# Patient Record
Sex: Male | Born: 1960 | Race: White | Hispanic: No | Marital: Married | Smoking: Never smoker
Health system: Southern US, Community
[De-identification: ages and names within clinical notes are randomized; demographics above are authoritative.]

## PROBLEM LIST (undated history)

## (undated) DIAGNOSIS — Z9289 Personal history of other medical treatment: Secondary | ICD-10-CM

## (undated) DIAGNOSIS — F419 Anxiety disorder, unspecified: Secondary | ICD-10-CM

## (undated) DIAGNOSIS — I2510 Atherosclerotic heart disease of native coronary artery without angina pectoris: Secondary | ICD-10-CM

## (undated) DIAGNOSIS — B029 Zoster without complications: Secondary | ICD-10-CM

## (undated) DIAGNOSIS — T7840XA Allergy, unspecified, initial encounter: Secondary | ICD-10-CM

## (undated) DIAGNOSIS — I10 Essential (primary) hypertension: Secondary | ICD-10-CM

## (undated) DIAGNOSIS — I499 Cardiac arrhythmia, unspecified: Secondary | ICD-10-CM

## (undated) DIAGNOSIS — K219 Gastro-esophageal reflux disease without esophagitis: Secondary | ICD-10-CM

## (undated) DIAGNOSIS — Z8619 Personal history of other infectious and parasitic diseases: Secondary | ICD-10-CM

## (undated) DIAGNOSIS — E785 Hyperlipidemia, unspecified: Secondary | ICD-10-CM

## (undated) DIAGNOSIS — T8859XA Other complications of anesthesia, initial encounter: Secondary | ICD-10-CM

## (undated) HISTORY — DX: Gastro-esophageal reflux disease without esophagitis: K21.9

## (undated) HISTORY — DX: Personal history of other infectious and parasitic diseases: Z86.19

## (undated) HISTORY — DX: Anxiety disorder, unspecified: F41.9

## (undated) HISTORY — DX: Zoster without complications: B02.9

## (undated) HISTORY — DX: Personal history of other medical treatment: Z92.89

## (undated) HISTORY — DX: Hyperlipidemia, unspecified: E78.5

## (undated) HISTORY — PX: TENDON REPAIR: SHX5111

## (undated) HISTORY — PX: TONSILLECTOMY: SUR1361

## (undated) HISTORY — DX: Essential (primary) hypertension: I10

## (undated) HISTORY — DX: Allergy, unspecified, initial encounter: T78.40XA

---

## 1995-08-19 DIAGNOSIS — E785 Hyperlipidemia, unspecified: Secondary | ICD-10-CM

## 1995-08-19 DIAGNOSIS — I10 Essential (primary) hypertension: Secondary | ICD-10-CM | POA: Insufficient documentation

## 1995-08-19 HISTORY — DX: Hyperlipidemia, unspecified: E78.5

## 1995-08-19 HISTORY — DX: Essential (primary) hypertension: I10

## 1995-10-19 DIAGNOSIS — Z9289 Personal history of other medical treatment: Secondary | ICD-10-CM

## 1995-10-19 HISTORY — DX: Personal history of other medical treatment: Z92.89

## 2002-02-01 ENCOUNTER — Encounter: Payer: Self-pay | Admitting: Emergency Medicine

## 2002-02-02 ENCOUNTER — Inpatient Hospital Stay (HOSPITAL_COMMUNITY): Admission: EM | Admit: 2002-02-02 | Discharge: 2002-02-02 | Payer: Self-pay | Admitting: Emergency Medicine

## 2002-02-02 ENCOUNTER — Encounter: Payer: Self-pay | Admitting: Internal Medicine

## 2002-06-30 ENCOUNTER — Encounter: Payer: Self-pay | Admitting: Family Medicine

## 2002-06-30 ENCOUNTER — Encounter: Admission: RE | Admit: 2002-06-30 | Discharge: 2002-06-30 | Payer: Self-pay | Admitting: Family Medicine

## 2002-12-18 DIAGNOSIS — K219 Gastro-esophageal reflux disease without esophagitis: Secondary | ICD-10-CM | POA: Insufficient documentation

## 2002-12-18 HISTORY — DX: Gastro-esophageal reflux disease without esophagitis: K21.9

## 2003-02-16 DIAGNOSIS — J309 Allergic rhinitis, unspecified: Secondary | ICD-10-CM | POA: Insufficient documentation

## 2005-01-20 ENCOUNTER — Ambulatory Visit: Payer: Self-pay | Admitting: Family Medicine

## 2005-10-14 ENCOUNTER — Emergency Department (HOSPITAL_COMMUNITY): Admission: EM | Admit: 2005-10-14 | Discharge: 2005-10-14 | Payer: Self-pay | Admitting: Family Medicine

## 2006-01-25 ENCOUNTER — Ambulatory Visit: Payer: Self-pay | Admitting: Family Medicine

## 2006-06-18 ENCOUNTER — Ambulatory Visit: Payer: Self-pay | Admitting: Family Medicine

## 2006-06-21 ENCOUNTER — Ambulatory Visit: Payer: Self-pay | Admitting: Family Medicine

## 2006-08-27 ENCOUNTER — Encounter: Admission: RE | Admit: 2006-08-27 | Discharge: 2006-08-27 | Payer: Self-pay | Admitting: Orthopedic Surgery

## 2006-12-19 ENCOUNTER — Ambulatory Visit: Payer: Self-pay | Admitting: Family Medicine

## 2006-12-21 ENCOUNTER — Ambulatory Visit: Payer: Self-pay | Admitting: Family Medicine

## 2007-01-21 ENCOUNTER — Ambulatory Visit: Payer: Self-pay | Admitting: Family Medicine

## 2007-04-09 ENCOUNTER — Ambulatory Visit: Payer: Self-pay | Admitting: Family Medicine

## 2007-05-17 ENCOUNTER — Telehealth (INDEPENDENT_AMBULATORY_CARE_PROVIDER_SITE_OTHER): Payer: Self-pay | Admitting: *Deleted

## 2007-07-16 ENCOUNTER — Ambulatory Visit: Payer: Self-pay | Admitting: Family Medicine

## 2007-07-17 ENCOUNTER — Ambulatory Visit: Payer: Self-pay | Admitting: Internal Medicine

## 2007-07-17 LAB — CONVERTED CEMR LAB
ALT: 34 units/L (ref 0–53)
AST: 28 units/L (ref 0–37)
Basophils Relative: 0.1 % (ref 0.0–1.0)
Cholesterol: 151 mg/dL (ref 0–200)
Direct LDL: 80.2 mg/dL
Hemoglobin: 14.9 g/dL (ref 13.0–17.0)
Monocytes Absolute: 0.5 10*3/uL (ref 0.2–0.7)
Monocytes Relative: 9.8 % (ref 3.0–11.0)
Platelets: 203 10*3/uL (ref 150–400)
RBC: 5.16 M/uL (ref 4.22–5.81)
RDW: 12.4 % (ref 11.5–14.6)
Total CHOL/HDL Ratio: 7.2
Triglycerides: 233 mg/dL (ref 0–149)
VLDL: 47 mg/dL — ABNORMAL HIGH (ref 0–40)

## 2007-08-28 ENCOUNTER — Telehealth (INDEPENDENT_AMBULATORY_CARE_PROVIDER_SITE_OTHER): Payer: Self-pay | Admitting: *Deleted

## 2007-09-05 ENCOUNTER — Ambulatory Visit: Payer: Self-pay | Admitting: Family Medicine

## 2007-09-05 DIAGNOSIS — I209 Angina pectoris, unspecified: Secondary | ICD-10-CM

## 2007-09-05 DIAGNOSIS — B009 Herpesviral infection, unspecified: Secondary | ICD-10-CM

## 2007-09-05 DIAGNOSIS — R079 Chest pain, unspecified: Secondary | ICD-10-CM | POA: Insufficient documentation

## 2007-09-05 DIAGNOSIS — R739 Hyperglycemia, unspecified: Secondary | ICD-10-CM

## 2008-07-08 ENCOUNTER — Telehealth: Payer: Self-pay | Admitting: Family Medicine

## 2008-07-12 ENCOUNTER — Emergency Department (HOSPITAL_COMMUNITY): Admission: EM | Admit: 2008-07-12 | Discharge: 2008-07-12 | Payer: Self-pay | Admitting: Emergency Medicine

## 2008-10-02 ENCOUNTER — Telehealth: Payer: Self-pay | Admitting: Family Medicine

## 2008-10-06 ENCOUNTER — Encounter: Payer: Self-pay | Admitting: Family Medicine

## 2008-10-27 ENCOUNTER — Telehealth: Payer: Self-pay | Admitting: Family Medicine

## 2008-11-10 ENCOUNTER — Ambulatory Visit: Payer: Self-pay | Admitting: Family Medicine

## 2008-12-01 ENCOUNTER — Ambulatory Visit: Payer: Self-pay | Admitting: Family Medicine

## 2008-12-01 LAB — CONVERTED CEMR LAB
ALT: 19 units/L (ref 0–53)
Basophils Absolute: 0 10*3/uL (ref 0.0–0.1)
Basophils Relative: 0.2 % (ref 0.0–3.0)
Bilirubin, Direct: 0.1 mg/dL (ref 0.0–0.3)
Calcium: 9.6 mg/dL (ref 8.4–10.5)
Cholesterol: 151 mg/dL (ref 0–200)
Creatinine, Ser: 1.1 mg/dL (ref 0.4–1.5)
Creatinine,U: 94.5 mg/dL
GFR calc Af Amer: 93 mL/min
HCT: 44.1 % (ref 39.0–52.0)
Hemoglobin: 15.4 g/dL (ref 13.0–17.0)
MCHC: 34.8 g/dL (ref 30.0–36.0)
MCV: 85.3 fL (ref 78.0–100.0)
Microalb Creat Ratio: 2.1 mg/g (ref 0.0–30.0)
Microalb, Ur: 0.2 mg/dL (ref 0.0–1.9)
Monocytes Absolute: 0.4 10*3/uL (ref 0.1–1.0)
Neutro Abs: 2.9 10*3/uL (ref 1.4–7.7)
RBC: 5.17 M/uL (ref 4.22–5.81)
RDW: 12.4 % (ref 11.5–14.6)
Sodium: 142 meq/L (ref 135–145)
Total Bilirubin: 0.8 mg/dL (ref 0.3–1.2)
Total Protein: 6.8 g/dL (ref 6.0–8.3)
Triglycerides: 142 mg/dL (ref 0–149)

## 2008-12-08 ENCOUNTER — Ambulatory Visit: Payer: Self-pay | Admitting: Family Medicine

## 2009-12-07 ENCOUNTER — Ambulatory Visit: Payer: Self-pay | Admitting: Family Medicine

## 2009-12-07 LAB — CONVERTED CEMR LAB
AST: 20 units/L (ref 0–37)
Albumin: 4.2 g/dL (ref 3.5–5.2)
BUN: 16 mg/dL (ref 6–23)
Calcium: 9.3 mg/dL (ref 8.4–10.5)
Cholesterol: 153 mg/dL (ref 0–200)
GFR calc non Af Amer: 76.03 mL/min (ref >60)
Glucose, Bld: 103 mg/dL — ABNORMAL HIGH (ref 70–99)
HDL: 26.9 mg/dL — ABNORMAL LOW (ref >39.00)
LDL Cholesterol: 105 mg/dL — ABNORMAL HIGH (ref 0–99)
Microalb, Ur: 0.6 mg/dL (ref 0.0–1.9)
Potassium: 3.9 meq/L (ref 3.5–5.1)
Sodium: 143 meq/L (ref 135–145)
Total Bilirubin: 0.7 mg/dL (ref 0.3–1.2)
VLDL: 21.6 mg/dL (ref 0.0–40.0)

## 2009-12-22 ENCOUNTER — Telehealth: Payer: Self-pay | Admitting: Family Medicine

## 2010-01-19 ENCOUNTER — Ambulatory Visit: Payer: Self-pay | Admitting: Family Medicine

## 2010-03-28 ENCOUNTER — Telehealth: Payer: Self-pay | Admitting: Family Medicine

## 2011-01-17 NOTE — Assessment & Plan Note (Signed)
Summary: CPX/CLE   Vital Signs:  Patient profile:   50 year old male Height:      69.5 inches Weight:      231.75 pounds BMI:     33.85 Temp:     98.3 degrees F oral Pulse rate:   76 / minute Pulse rhythm:   regular BP sitting:   132 / 86  (left arm) Cuff size:   large  Vitals Entered By: Sydell Axon LPN (January 19, 2010 1:52 PM) CC: 30 Minute checkup   History of Present Illness: Pt here for Comp Exam, has GI version of what is going around right now. Occas when driving gets SOB with panic attack.  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 One Tab By Mouth At Night 2)  Valtrex 1 Gm Tabs (Valacyclovir Hcl) .... 1/2 Tablet Q Day 3)  Diovan 80 Mg Tabs (Valsartan) .Marland Kitchen.. 1 Tab By Mouth At Night 4)  Astelin 137 Mcg/spray Soln (Azelastine Hcl) .... 2 Sprays Each Nare Every Day Prn 5)  Nexium 40 Mg Cpdr (Esomeprazole Magnesium) .Marland Kitchen.. 1 Daily By Mouth  Allergies: 1)  ! * Bees  Past History:  Past Medical History: Last updated: 04/22/2007 Hypertension (08/19/1995) Hyperlipidemia (08/19/1995) Allergic rhinitis (02/16/2003) GERD (12/18/2002)  Past Surgical History: Last updated: 04/22/2007 ETT wnl  11/96 cardiology consult-- [place] 1/97 R/O'D 02/02/2002  Family History: Last updated: 01/19/2010 Father dec 68 MI CABG x 3 Pacer  Mother A 86  DM Heart Dz  Brother A 59 DM Brother A 57 MIx2 Stents Sister A 62 benign tumor of the brain, eye reconstruction. Sister A 50  Social History: Last updated: 09/05/2007 Occupation:Tool [employer] Married Divorced Remarried  Stepdaughter  Risk Factors: Alcohol Use: <1 (01/19/2010) Caffeine Use: 0 (01/19/2010) Exercise: yes (01/19/2010)  Risk Factors: Smoking Status: never (01/19/2010) Passive Smoke Exposure: no (01/19/2010)  Family History: Father dec 68 MI CABG x 3 Pacer  Mother A 72  DM Heart Dz  Brother A 59 DM Brother A 57 MIx2 Stents Sister A 62 benign tumor of the brain, eye reconstruction. Sister A 50  Review of Systems General:  Denies chills, fatigue, fever, sweats, weakness, and weight loss. Eyes:  Denies blurring, discharge, and eye pain; wears glasses. ENT:  Complains of ringing in ears; denies decreased hearing and earache; L>R. CV:  Denies chest pain or discomfort, fainting, fatigue, palpitations, shortness of breath with exertion, swelling of feet, and swelling of hands. Resp:  Denies cough, shortness of breath, and wheezing. GI:  Denies abdominal pain, bloody stools, change in bowel habits, constipation, dark tarry stools, diarrhea, indigestion, loss of appetite, nausea, vomiting, vomiting blood,  and yellowish skin color. GU:  Denies discharge, dysuria, nocturia, and urinary frequency. MS:  Denies joint pain, joint swelling, low back pain, muscle aches, cramps, and muscle weakness. Derm:  Denies dryness, flushing, itching, and rash. Neuro:  Denies numbness, poor balance, tingling, and tremors.   Impression & Recommendations:  Problem # 1:  HEALTH MAINTENANCE EXAM (ICD-V70.0) Assessment Comment Only  Problem # 2:  HYPERGLYCEMIA, 8/05 (ICD-790.6) Assessment: Unchanged Avoid sweets and carbs.  Problem # 3:   HERPES SIMPLEX, UNCOMPLICATED (ICD-054.9) Assessment: Unchanged Discussed .  Problem # 4:  HYPERLIPIDEMIA (ICD-272.4) Assessment: Unchanged Adequate. His updated medication list for this problem includes:    Lipitor 10 Mg Tabs (Atorvastatin calcium) ..... One tab by mouth at night  Labs Reviewed: SGOT: 20 (12/07/2009)   SGPT: 17 (12/07/2009)   HDL:26.90 (12/07/2009), 24.5 (12/01/2008)  LDL:105 (12/07/2009), 98 (04/54/0981)  Chol:153 (12/07/2009), 151 (12/01/2008)  Trig:108.0 (12/07/2009), 142 (12/01/2008)  Problem # 5:  HYPERTENSION (ICD-401.9) Assessment: Unchanged Stable. Check occas. His updated medication list for this problem includes:    Diovan 80 Mg Tabs (Valsartan) .Marland Kitchen... 1 tab by mouth at night  BP today: 132/86 Prior BP: 120/80 (12/08/2008)  Labs Reviewed: K+: 3.9 (12/07/2009) Creat: : 1.1 (12/07/2009)   Chol: 153 (12/07/2009)   HDL: 26.90 (12/07/2009)   LDL: 105 (12/07/2009)   TG: 108.0 (12/07/2009)  Complete Medication List: 1)  Lipitor 10 Mg Tabs (Atorvastatin calcium) .... One tab by mouth at night 2)  Valtrex 1 Gm Tabs (Valacyclovir hcl) .... 1/2 tablet daily 3)  Diovan 80 Mg Tabs (Valsartan) .Marland Kitchen.. 1 tab by mouth at night 4)  Astelin 137 Mcg/spray Soln (Azelastine hcl) .... 2 sprays each nare every day as needed 5)  Nexium 40 Mg Cpdr (Esomeprazole magnesium) .Marland Kitchen.. 1 daily by mouth  Patient Instructions: 1)  Take Guaifenesin by [facility] or [facility] and getting MUCOUS RELIEF EXPECTORANT (400mg ), take 11/2 tabs by mouth AM and NOON. 2)  Drink lots of fluids anytime taking Guaifenesin.  3)  RTC as needed.  Current Allergies (reviewed today): ! * BEES

## 2011-01-17 NOTE — Progress Notes (Signed)
Summary: Rx Valacyclovir  Phone Note Refill Request Call back at [phone] Message from:  [facility] on December 22, 2009 8:53 AM  Refills Requested: Medication #1:  VALTREX 1 GM TABS 1/2 tablet q day   Last Refilled: 10/04/2009  Medication #2:  LIPITOR 10 MG TABS one tab by mouth at night   Last Refilled: 08/09/2009 Received faxed refill request for Valacyclovir HCL 500mg , one tablet daily, med sheet says 1Gram. Please advise   Method Requested: Electronic Initial call taken by: Linde Gillis CMA Duncan Dull),  December 22, 2009 8:55 AM  Follow-up for Phone Call        1/2 of 1000mg  is 500mg .  Please insure he keeps his appt for PE in Feb and get labs prior (Labs are not scheduled at this point.) Follow-up by: Shaune Leeks MD,  December 22, 2009 10:22 AM  Additional Follow-up for Phone Call Additional follow up Details #1::        Patient advised as instructed.  He says that he already had labs done.  Will keep his PE appt in Feb. Additional Follow-up by: Linde Gillis CMA Duncan Dull),  December 22, 2009 10:31 AM    Prescriptions: LIPITOR 10 MG TABS (ATORVASTATIN CALCIUM) one tab by mouth at night  #30 Each x 1   Entered and Authorized by:   Shaune Leeks MD   Signed by:   Shaune Leeks MD on 12/22/2009   Method used:   [facility]        [facility]  [address] [id number]       [address]       [place], [place]  [address]       Ph: [phone]       Fax: [phone]   RxID:   [id number] VALTREX 1 GM TABS (VALACYCLOVIR HCL) 1/2 tablet q day  #15 Tablet x 1   Entered and Authorized by:   Shaune Leeks MD   Signed by:   Shaune Leeks MD on 12/22/2009   Method used:   Electronically to        [facility]  [address] [phone][facility] (retail)       [address]       [place], [place]  [address]       Ph: [phone]       Fax: [phone]   RxID:   [id number]

## 2011-01-17 NOTE — Progress Notes (Signed)
Summary: Rx Valtrex  Phone Note Refill Request Message [facility] March 28, 2010 8:07 AM  Refills Requested: Medication #1:  VALTREX 1 GM TABS 1/2 tablet daily Received e-scribe refill request   Method Requested: Electronic Initial call taken by: Sydell Axon LPN,  March 28, 2010 8:07 AM    Prescriptions: VALTREX 1 GM TABS (VALACYCLOVIR HCL) 1/2 tablet daily  #15 x 12   Entered and Authorized by:   Shaune Leeks MD   Signed by:   Shaune Leeks MD on 03/28/2010   Method used:   Electronically [facility]        [facility] [id number]       [address]       [place], [place]  [address]       Ph: [phone]       Fax: [phone]   RxID:   [id number]

## 2011-02-02 ENCOUNTER — Telehealth: Payer: Self-pay | Admitting: Family Medicine

## 2011-02-07 ENCOUNTER — Telehealth: Payer: Self-pay | Admitting: Family Medicine

## 2011-02-08 NOTE — Progress Notes (Signed)
Summary: Rx Diovan and  ? Lipitor  Phone Note Refill Request Call back at [phone] Message from:  [facility] on February 02, 2011 2:57 PM  Refills Requested: Medication #1:  DIOVAN 80 MG TABS 1 tab by mouth at night RECEIVED FORM FROM PHARMACY REGARDING CHANGING LIPITOR TO A PREFERRED ALTERNATIVE: ATORVASTATIN, LOVASTATIN OR PRAVASTATIN. FORMS ARE IN YOUR IN BOX.  Initial call taken by: Sydell Axon LPN,  February 02, 2011 3:00 PM  Follow-up for Phone Call        Diovan signed. Need to see him for the Lipitor change. Follow-up by: Shaune Leeks MD,  February 02, 2011 4:14 PM  Additional Follow-up for Phone Call Additional follow up Details #1::        Rx faxed back to [facility]. Sydell Axon LPN  February 02, 2011 4:27 PM  Left message with patient's wife to call back. Sydell Axon LPN  February 02, 2011 4:31 PM     Additional Follow-up for Phone Call Additional follow up Details #2::    Patient says that he has appt in may to see you and front staff told him that was first available Follow-up by: Benny Lennert CMA Duncan Dull),  February 02, 2011 4:39 PM  Prescriptions: DIOVAN 80 MG TABS (VALSARTAN) 1 tab by mouth at night  #90 x 3   Entered by:   Sydell Axon LPN   Authorized by:   Shaune Leeks MD   Signed by:   Sydell Axon LPN on 78/46/9629   Method used:   Handwritten   RxID:   [id number]

## 2011-02-14 NOTE — Progress Notes (Signed)
Summary: new script for valtrex  Phone Note Refill Request Message from:  Fax from Pharmacy  Refills Requested: Medication #1:  VALTREX 1 GM TABS 1/2 tablet daily [facility] on [facility]. They are requesting a new script  Initial call taken by: Lowella Petties CMA, AAMA,  February 07, 2011 5:13 PM  Follow-up for Phone Call        Has appt for Comp Exam in May. Will fill out. Follow-up by: Shaune Leeks MD,  February 08, 2011 7:31 AM  Additional Follow-up for Phone Call Additional follow up Details #1::        [facility]            Lowella Petties CMA, AAMA  February 08, 2011 8:27 AM     Prescriptions: VALTREX 1 GM TABS (VALACYCLOVIR HCL) 1/2 tablet daily  #15 x 12   Entered and Authorized by:   Shaune Leeks MD   Signed by:   Shaune Leeks MD on 02/08/2011   Method used:   Printed then faxed to ...       [facility]  [address] [id number]       [address]       [place], [place]  [address]       Ph: [phone]       Fax: [phone]   RxID:   [id number]

## 2011-02-14 NOTE — Progress Notes (Signed)
Summary: clarification needed on diovan  Phone Note From Pharmacy   [facility]er[facility] Summary of Call: Pharmacy is asking for clarification on diovan script, form is on your desk.                  Lowella Petties CMA, AAMA  February 07, 2011 9:35 AM   Follow-up for Phone Call        One by mouth at night.  Written and signed. Follow-up by: Shaune Leeks MD,  February 08, 2011 7:22 AM  Additional Follow-up for Phone Call Additional follow up Details #1::        [facility].             Lowella Petties CMA, AAMA  February 08, 2011 8:29 AM

## 2011-04-01 ENCOUNTER — Encounter: Payer: Self-pay | Admitting: Family Medicine

## 2011-04-23 ENCOUNTER — Other Ambulatory Visit: Payer: Self-pay | Admitting: Family Medicine

## 2011-04-27 ENCOUNTER — Other Ambulatory Visit: Payer: Self-pay | Admitting: Family Medicine

## 2011-04-27 DIAGNOSIS — R7989 Other specified abnormal findings of blood chemistry: Secondary | ICD-10-CM

## 2011-04-27 DIAGNOSIS — I209 Angina pectoris, unspecified: Secondary | ICD-10-CM

## 2011-04-27 DIAGNOSIS — E785 Hyperlipidemia, unspecified: Secondary | ICD-10-CM

## 2011-04-27 DIAGNOSIS — I10 Essential (primary) hypertension: Secondary | ICD-10-CM

## 2011-04-27 DIAGNOSIS — K219 Gastro-esophageal reflux disease without esophagitis: Secondary | ICD-10-CM

## 2011-04-27 NOTE — Telephone Encounter (Signed)
He has appt next week. OK to fill.

## 2011-04-27 NOTE — Telephone Encounter (Signed)
Can this be refilled? 

## 2011-05-01 ENCOUNTER — Other Ambulatory Visit (INDEPENDENT_AMBULATORY_CARE_PROVIDER_SITE_OTHER): Payer: 59 | Admitting: Family Medicine

## 2011-05-01 ENCOUNTER — Telehealth: Payer: Self-pay | Admitting: *Deleted

## 2011-05-01 DIAGNOSIS — E785 Hyperlipidemia, unspecified: Secondary | ICD-10-CM

## 2011-05-01 DIAGNOSIS — I10 Essential (primary) hypertension: Secondary | ICD-10-CM

## 2011-05-01 DIAGNOSIS — R7989 Other specified abnormal findings of blood chemistry: Secondary | ICD-10-CM

## 2011-05-01 DIAGNOSIS — K219 Gastro-esophageal reflux disease without esophagitis: Secondary | ICD-10-CM

## 2011-05-01 DIAGNOSIS — I209 Angina pectoris, unspecified: Secondary | ICD-10-CM

## 2011-05-01 LAB — RENAL FUNCTION PANEL
Albumin: 4.2 g/dL (ref 3.5–5.2)
BUN: 18 mg/dL (ref 6–23)
CO2: 27 mEq/L (ref 19–32)
Creatinine, Ser: 1 mg/dL (ref 0.4–1.5)
GFR: 86.36 mL/min (ref >60.00)
Phosphorus: 2.7 mg/dL (ref 2.3–4.6)

## 2011-05-01 LAB — CBC WITH DIFFERENTIAL/PLATELET
Basophils Absolute: 0 10*3/uL (ref 0.0–0.1)
Basophils Relative: 0.4 % (ref 0.0–3.0)
HCT: 43.8 % (ref 39.0–52.0)
Hemoglobin: 15.1 g/dL (ref 13.0–17.0)
Lymphocytes Relative: 26.6 % (ref 12.0–46.0)
Lymphs Abs: 1.3 10*3/uL (ref 0.7–4.0)
Monocytes Relative: 9.8 % (ref 3.0–12.0)
Neutro Abs: 2.9 10*3/uL (ref 1.4–7.7)
RBC: 5.15 Mil/uL (ref 4.22–5.81)
RDW: 14.4 % (ref 11.5–14.6)

## 2011-05-01 LAB — LIPID PANEL: VLDL: 31 mg/dL (ref 0.0–40.0)

## 2011-05-01 LAB — HEPATIC FUNCTION PANEL
ALT: 25 U/L (ref 0–53)
AST: 20 U/L (ref 0–37)
Bilirubin, Direct: 0.1 mg/dL (ref 0.0–0.3)
Total Bilirubin: 0.4 mg/dL (ref 0.3–1.2)

## 2011-05-01 LAB — MICROALBUMIN / CREATININE URINE RATIO
Creatinine,U: 140.6 mg/dL
Microalb Creat Ratio: 0.5 mg/g (ref 0.0–30.0)

## 2011-05-01 LAB — TSH: TSH: 0.61 u[IU]/mL (ref 0.35–5.50)

## 2011-05-01 NOTE — Telephone Encounter (Signed)
Pt came in earlier today stating he had found a tick on his back- small deer tick, he doesn't think it was on for very long.  Asked if he should be concerned.  Advised to watch for rashes, fever, joint pain, any signs of illness.  Call back if any problems.

## 2011-05-02 NOTE — Assessment & Plan Note (Signed)
[facility]                                 ON-CALL NOTE   Erik Mitchell, Erik Mitchell                       MRN:          [id number]  DATE:07/12/2008                            DOB:          1961/02/27    Time is 1:30 p.m.   PHONE NUMBER:  [phone].   CHIEF COMPLAINT:  Fever.   The patient said he has had fever and aches since last night.  His  maximum temperature was 101.  He has taken some Advil, but it is not  getting much better.  He has no other symptomsat all, no sore throat,  runny nose, stuffy nose, cough, nausea, vomiting, or diarrhea.  He does  have a little headache, otherwise does not know where the fever is  coming from.  He wants to go to Urgent Care for further evaluation and I  advised him to do so.  Of note, he did not give his regular physician.   Regular Physician is Dr. Hetty Ely.     Marne A. Tower, MD  Electronically Signed    MAT/MedQ  DD: 07/12/2008  DT: 07/12/2008  Job #: [id number]

## 2011-05-02 NOTE — Telephone Encounter (Signed)
Agree. Tick must be adherent for 24 hrs to confer any risk. Thank you.

## 2011-05-03 ENCOUNTER — Encounter: Payer: Self-pay | Admitting: Family Medicine

## 2011-05-05 NOTE — Discharge Summary (Signed)
[place]  Patient:    Erik Mitchell, Erik Mitchell Visit Number: [id number] MRN: [id number]          Service Type: MED Location: [phone] Attending Physician:  Nathen May Dictated by:   Joellyn Rued, P.A.-C. Admit Date:  02/01/2002 Discharge Date: 02/02/2002   CC:         Laurita Quint, M.D. [facility] C. Eden Emms, M.D. [facility]   Discharge Summary  DATE OF BIRTH: [birth year]  HISTORY OF PRESENT ILLNESS: Mr. Ager is a 50 year old white male, who was transported via EMS to [employer] [facility] Emergency Room with chest discomfort around 7 p.m. the evening of admission.  He developed mid sternal chest tightness while driving.  He did not have any associated symptoms and the discomfort lasted for several hours; thus, he came to the emergency room and his discomfort was relieved with sublingual nitroglycerin. It feels like his discomfort is similar to his previous discomfort that he had back in 1995 or 1996, when a stress test performed by Dr. Hetty Ely was negative.  He has a history of heartburn but he denies any of that at this point.  In the emergency room he was pain-free.  He also states he has been under some stress and that he broke up with his girlfriend yesterday.  He has a history of hypertension, hyperlipidemia, mild obesity.  LABORATORY DATA: H&H 15.3 and 43.8, normal indices; platelets 219,000; WBC 6.2.  Sodium 140, potassium 3.4, BUN 18, creatinine 1.2, glucose 105.  CKs and troponins were negative for myocardial infarction.  Fasting lipids showed a total cholesterol of 212, triglyceride 195, HDL 30, LDL 143.  HOSPITAL COURSE: Mr. Gellert was admitted to 62.  Overnight he did not have any further chest discomfort, and he ruled out for myocardial infarction. Stress Cardiolite was performed without difficulty.  Imaging EKG did not show any changes.  Blood pressure and heart rate responded appropriately.  Imaging showed  an EF of 67% without signs of ischemia or scar.  It was felt that he could be discharged home.  DIAGNOSES:  1. Noncardiac chest discomfort.  2. Hyperlipidemia.  3. Hypertension.  DISPOSITION: He is discharged home.  DISCHARGE MEDICATIONS:  1. He was asked to continue Diovan 80 mg q.d.  2. He was also asked to take baby aspirin 81 mg q.d.  DISCHARGE ACTIVITY: Not restricted.  DISCHARGE DIET: He was given instructions and reading information in regard to a low-salt/low-fat/low-cholesterol diet.  He was instructed if he is not able to reduce his hyperlipidemia over the next several months with diet, Dr. Hetty Ely may consider starting him on a prescription medication given his risk factors and early family history of heart disease.  FOLLOW-UP: He was also asked to arrange a follow-up appointment with Dr. Hetty Ely in regard to his chest discomfort.  He was also asked to keep a diary of a food log, causing weight loss program, exercise program, and a diary of his blood pressures. Dictated by:   Joellyn Rued, P.A.-C. Attending Physician:  Nathen May DD:  02/02/02 TD:  02/02/02 Job: [id number]

## 2011-07-26 ENCOUNTER — Other Ambulatory Visit: Payer: Self-pay | Admitting: Family Medicine

## 2011-07-31 ENCOUNTER — Other Ambulatory Visit: Payer: Self-pay | Admitting: *Deleted

## 2011-07-31 MED ORDER — ESOMEPRAZOLE MAGNESIUM 40 MG PO CPDR: DELAYED_RELEASE_CAPSULE | ORAL | Status: DC

## 2011-08-02 ENCOUNTER — Ambulatory Visit (INDEPENDENT_AMBULATORY_CARE_PROVIDER_SITE_OTHER): Payer: Commercial Managed Care - PPO | Admitting: Family Medicine

## 2011-08-02 ENCOUNTER — Encounter: Payer: Self-pay | Admitting: Family Medicine

## 2011-08-02 DIAGNOSIS — K219 Gastro-esophageal reflux disease without esophagitis: Secondary | ICD-10-CM

## 2011-08-02 DIAGNOSIS — I10 Essential (primary) hypertension: Secondary | ICD-10-CM

## 2011-08-02 DIAGNOSIS — J309 Allergic rhinitis, unspecified: Secondary | ICD-10-CM

## 2011-08-02 DIAGNOSIS — R7989 Other specified abnormal findings of blood chemistry: Secondary | ICD-10-CM

## 2011-08-02 DIAGNOSIS — E785 Hyperlipidemia, unspecified: Secondary | ICD-10-CM

## 2011-08-02 NOTE — Assessment & Plan Note (Signed)
Continues slightly elevated. Cautioned to avoid sweets and carbs to keep from accelerating to diabetes.

## 2011-08-02 NOTE — Assessment & Plan Note (Signed)
Stable, use mask when cutting grass.

## 2011-08-02 NOTE — Assessment & Plan Note (Signed)
Adequate on Lipitor 10. Exercise more to get HDL up.

## 2011-08-02 NOTE — Progress Notes (Signed)
  Subjective:    Patient ID: Erik Mitchell, male    DOB: 03/25/1961, 50 y.o.   MRN: [id number]  HPI Pt here for Comp Exam. HE has not been here since his last in Feb, 11. He feels well and has no complaints. He has been doing well. He is back having problems with his knee and will see his orthopedist soon. This has been a problem before.     Review of Systems  Constitutional: Negative for fever, chills, diaphoresis, appetite change, fatigue and unexpected weight change.  HENT: Positive for tinnitus. Negative for hearing loss, ear pain and ear discharge.   Eyes: Negative for pain, discharge and visual disturbance.  Respiratory: Negative for cough, shortness of breath and wheezing.   Cardiovascular: Negative for chest pain and palpitations.       No SOB w/ exertion  Gastrointestinal: Negative for nausea, vomiting, abdominal pain, diarrhea, constipation and blood in stool.       No heartburn or swallowing problems.  Genitourinary: Negative for dysuria, frequency and difficulty urinating.       No nocturia  Musculoskeletal: Positive for arthralgias (left knee pain, twisted 6 mos ago and got better, now worse again.). Negative for myalgias and back pain.  Skin: Negative for rash.       No itching or dryness.  Neurological: Negative for tremors and numbness.       No tingling or balance problems.  Hematological: Negative for adenopathy. Does not bruise/bleed easily.  Psychiatric/Behavioral: Negative for dysphoric mood and agitation.       Objective:   Physical Exam  Constitutional: He is oriented to person, place, and time. He appears well-developed and well-nourished. No distress.  HENT:  Head: Normocephalic and atraumatic.  Right Ear: External ear normal.  Left Ear: External ear normal.  Nose: Nose normal.  Mouth/Throat: Oropharynx is clear and moist.  Eyes: Conjunctivae and EOM are normal. Pupils are equal, round, and reactive to light. Right eye exhibits no discharge. Left eye  exhibits no discharge. No scleral icterus.  Neck: Normal range of motion. Neck supple. No thyromegaly present.  Cardiovascular: Normal rate, regular rhythm, normal heart sounds and intact distal pulses.   No murmur heard. Pulmonary/Chest: Effort normal and breath sounds normal. No respiratory distress. He has no wheezes.  Abdominal: Soft. Bowel sounds are normal. He exhibits no distension and no mass. There is no tenderness. There is no rebound and no guarding.  Genitourinary: Penis normal.       No hernias felt.  Musculoskeletal: Normal range of motion. He exhibits no edema.  Lymphadenopathy:    He has no cervical adenopathy.  Neurological: He is alert and oriented to person, place, and time. Coordination normal.  Skin: Skin is warm and dry. No rash noted. He is not diaphoretic.  Psychiatric: He has a normal mood and affect. His behavior is normal. Judgment and thought content normal.          Assessment & Plan:  HMPE

## 2011-08-02 NOTE — Assessment & Plan Note (Signed)
Well controlled. Cont Nexium.

## 2011-08-02 NOTE — Assessment & Plan Note (Signed)
Adequate control. Cont curr meds. BP Readings from Last 3 Encounters:  08/02/11 132/80  01/19/10 132/86  12/08/08 120/80

## 2011-08-03 ENCOUNTER — Telehealth: Payer: Self-pay | Admitting: *Deleted

## 2011-08-03 NOTE — Telephone Encounter (Signed)
Reggie at pharmacy notified as instructed by telephone.

## 2011-08-03 NOTE — Telephone Encounter (Signed)
Epipen is what I wanted in the first place. Thank them for me.

## 2011-08-03 NOTE — Telephone Encounter (Signed)
Received faxed from [facility] asking if you would want to change the ANA kit to a Epipen because the ANA kit is discontinued. Please advise. Uses [facility] on [address]

## 2011-09-04 ENCOUNTER — Other Ambulatory Visit: Payer: Self-pay | Admitting: Family Medicine

## 2011-09-07 ENCOUNTER — Telehealth: Payer: Self-pay | Admitting: *Deleted

## 2011-09-07 NOTE — Telephone Encounter (Signed)
Form filled out. Has been taking this med since 9/02.

## 2011-09-07 NOTE — Telephone Encounter (Signed)
Prior Berkley Harvey is needed for diovan, form is on your desk.

## 2011-09-08 NOTE — Telephone Encounter (Signed)
Prior auth given for diovan, advised pharmacy.  Approval letter placed on doctor's desk for signature and scanning.

## 2011-09-15 LAB — CBC
Hemoglobin: 15.7
RDW: 13.6
WBC: 5.2

## 2011-09-15 LAB — DIFFERENTIAL
Basophils Absolute: 0
Eosinophils Relative: 1
Lymphocytes Relative: 12
Monocytes Relative: 10
Neutro Abs: 4

## 2012-01-02 ENCOUNTER — Other Ambulatory Visit: Payer: Self-pay | Admitting: Family Medicine

## 2012-03-21 ENCOUNTER — Other Ambulatory Visit: Payer: Self-pay | Admitting: Family Medicine

## 2012-03-22 NOTE — Telephone Encounter (Signed)
Sent!

## 2012-06-10 ENCOUNTER — Encounter: Payer: Self-pay | Admitting: Family Medicine

## 2012-06-10 ENCOUNTER — Ambulatory Visit (INDEPENDENT_AMBULATORY_CARE_PROVIDER_SITE_OTHER): Payer: Commercial Managed Care - PPO | Admitting: Family Medicine

## 2012-06-10 VITALS — BP 130/82 | HR 73 | Temp 98.8°F | Ht 69.0 in | Wt 238.2 lb

## 2012-06-10 DIAGNOSIS — M2550 Pain in unspecified joint: Secondary | ICD-10-CM

## 2012-06-10 DIAGNOSIS — T148 Other injury of unspecified body region: Secondary | ICD-10-CM

## 2012-06-10 DIAGNOSIS — T148XXA Other injury of unspecified body region, initial encounter: Secondary | ICD-10-CM

## 2012-06-10 DIAGNOSIS — W57XXXA Bitten or stung by nonvenomous insect and other nonvenomous arthropods, initial encounter: Secondary | ICD-10-CM

## 2012-06-10 MED ORDER — DOXYCYCLINE HYCLATE 100 MG PO TABS
100.0000 mg | ORAL_TABLET | Freq: Two times a day (BID) | ORAL | Status: AC
Start: 2012-06-10 — End: 2012-06-20

## 2012-06-10 NOTE — Progress Notes (Signed)
Nature conservation officer at [facility] [address] [place] [address] Phone: [phone] Fax: [phone]   Patient Name: Erik Mitchell Date of Birth: March 05, 1961 Age: 51 y.o. Medical Record Number: [id number] Gender: male Date of Encounter: 06/10/2012  History of Present Illness:  Erik Mitchell is a 51 y.o. very pleasant male patient who presents with the following:  Knees, back.  2 weeks.  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   Pleasant gentleman who presents with diffuse polyarthralgias and myalgias over the last several weeks and particularly pain in his knees and his back with some mild effusions in his knees. He has not done anything different compared to his normal active lifestyle. 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 He does this essentially every day, so this is not unusual, and he remembers no specific injury.  He is pulled multiple ticks off of him in the last month or so. 2 of which were dug into his skin quite a bit.  Past Medical History, Surgical History, Social History, Family History, Problem List, Medications, and Allergies have been reviewed and updated if relevant.  Prior to Admission medications   Medication Sig Start Date End Date Taking? Authorizing Provider  aspirin 81 MG tablet Take 81 mg by mouth daily.     Yes Historical Provider, MD  atorvastatin (LIPITOR) 10 MG tablet TAKE  ONE TABLET BY MOUTH NIGHTLY AT BEDTIME 03/21/12  Yes Joaquim Nam, MD  DIOVAN 80 MG tablet TAKE  ONE TABLET BY MOUTH NIGHTLY AT BEDTIME 01/02/12  Yes Joaquim Nam, MD  NEXIUM 40 MG capsule TAKE ONE CAPSULE BY MOUTH ONE TIME DAILY 01/02/12  Yes Joaquim Nam, MD  valACYclovir (VALTREX) 1000 MG tablet TAKE HALF TABLET BY MOUTH DAILY 03/21/12  Yes Joaquim Nam, MD    Review of Systems: ROS: GEN: Acute illness details above GI: Tolerating PO intake GU: maintaining  adequate hydration and urination Pulm: No SOB Interactive and getting along well at home.  Otherwise, ROS is as per the HPI.   Physical Examination: Filed Vitals:   06/10/12 1557  BP: 130/82  Pulse: 73  Temp: 98.8 F (37.1 C)   Filed Vitals:   06/10/12 1557  Height: 5\' 9"  (1.753 m)  Weight: 238 lb 4 oz (108.069 kg)   Body mass index is 35.18 kg/(m^2). Ideal Body Weight: Weight in (lb) to have BMI = 25: 168.9    GEN: WDWN, NAD, Non-toxic, A & O x 3 HEENT: Atraumatic, Normocephalic. Neck supple. No masses, No LAD. Ears and Nose: No external deformity. CV: RRR, No M/G/R. No JVD. No thrill. No extra heart sounds. PULM: CTA B, no wheezes, crackles, rhonchi. No retractions. No resp. distress. No accessory muscle use. EXTR: No c/c/e NEURO Normal gait.  Several areas that are red noted patient to me by the patient where he said ticks were on his skin.  Bilateral knees: Full extension, flexion to 130. Mild effusions. Stable to varus and valgus stress. Mild medial joint line tenderness. Negative McMurray's. Negative flexion pinched testing. Negative Lachman. Negative anterior and posterior drawer testing.  Full range of motion at the back in all directions. Sensation is intact throughout and neurovascularly intact. Deep tendon reflexes are 2+ in the lower extremity. Strength is 5/5. Straight leg raise is negative. Hip flexion and abduction is 5/5. Mildly tender  around L4-S1 PSYCH: Normally interactive. Conversant. Not depressed or anxious appearing.  Calm demeanor.    Assessment and Plan: 1. Arthralgia  doxycycline (VIBRA-TABS) 100 MG tablet, B. burgdorfi antibodies, Rocky mtn spotted fvr abs pnl(IgG+IgM)  2. Tick bite  doxycycline (VIBRA-TABS) 100 MG tablet, B. burgdorfi antibodies, Rocky mtn spotted fvr abs pnl(IgG+IgM)    With his history, think that we need to be conscious and treat him with doxycycline.  It is possible that all this is purely arthritis exacerbation, and I am  also going to have him do some anti-inflammatories at home.  Hannah Beat, MD

## 2012-06-11 LAB — B. BURGDORFI ANTIBODIES: B burgdorferi Ab IgG+IgM: 0.19 {ISR}

## 2012-06-12 ENCOUNTER — Encounter: Payer: Self-pay | Admitting: *Deleted

## 2012-06-12 LAB — ROCKY MTN SPOTTED FVR ABS PNL(IGG+IGM)
RMSF IgG: 0.1 IV
RMSF IgM: 0.04 IV

## 2012-06-13 ENCOUNTER — Telehealth: Payer: Self-pay

## 2012-06-13 NOTE — Telephone Encounter (Signed)
Pt request results for 06/10/12 labs. Patient notified as instructed by telephone. Letter already mailed also.

## 2012-07-31 ENCOUNTER — Ambulatory Visit (INDEPENDENT_AMBULATORY_CARE_PROVIDER_SITE_OTHER): Payer: Commercial Managed Care - PPO | Admitting: Family Medicine

## 2012-07-31 ENCOUNTER — Encounter: Payer: Self-pay | Admitting: Family Medicine

## 2012-07-31 VITALS — BP 130/84 | HR 75 | Temp 97.6°F | Ht 69.0 in | Wt 240.0 lb

## 2012-07-31 DIAGNOSIS — Z125 Encounter for screening for malignant neoplasm of prostate: Secondary | ICD-10-CM

## 2012-07-31 DIAGNOSIS — R5381 Other malaise: Secondary | ICD-10-CM

## 2012-07-31 DIAGNOSIS — R5383 Other fatigue: Secondary | ICD-10-CM

## 2012-07-31 DIAGNOSIS — E785 Hyperlipidemia, unspecified: Secondary | ICD-10-CM

## 2012-07-31 DIAGNOSIS — M2550 Pain in unspecified joint: Secondary | ICD-10-CM

## 2012-07-31 LAB — BASIC METABOLIC PANEL
Calcium: 9.9 mg/dL (ref 8.4–10.5)
Chloride: 104 mEq/L (ref 96–112)
Creatinine, Ser: 1.1 mg/dL (ref 0.4–1.5)

## 2012-07-31 LAB — HEPATIC FUNCTION PANEL
ALT: 24 U/L (ref 0–53)
Alkaline Phosphatase: 89 U/L (ref 39–117)
Bilirubin, Direct: 0.1 mg/dL (ref 0.0–0.3)
Total Protein: 7.1 g/dL (ref 6.0–8.3)

## 2012-07-31 LAB — CBC WITH DIFFERENTIAL/PLATELET
Basophils Relative: 0.5 % (ref 0.0–3.0)
Eosinophils Relative: 2.2 % (ref 0.0–5.0)
Lymphocytes Relative: 25.2 % (ref 12.0–46.0)
Neutrophils Relative %: 63.1 % (ref 43.0–77.0)
RBC: 5.07 Mil/uL (ref 4.22–5.81)
WBC: 4.6 10*3/uL (ref 4.5–10.5)

## 2012-07-31 NOTE — Progress Notes (Signed)
[facility] at [facility] [address] [place] [address] Phone: [phone] Fax: [phone]  Date:  07/31/2012   Name:  Erik Mitchell   DOB:  15-Jun-1961   MRN:  [id number] Gender: male  Age: 51 y.o.  PCP:  Crawford Givens, MD    Chief Complaint: Hypertension   History of Present Illness:  Erik Mitchell is a 51 y.o. very pleasant male patient who presents with the following:  The patient comes in the office not feeling well, and I am asked to see him urgently. He reports that his blood pressure was 200/115 at work, but when the paramedics were called his blood pressure normalized. On my check in the office it is 120/84.  Overall he does not feel that well, he has some coughing, congestion. Some nasal congestion and overall achiness.  I treated him a few weeks ago for potential tickborne illness with a course of doxycycline. There are somewhat concerned about Lyme disease potentially.  He has never had other diffuse arthralgias and has tolerated his statin without difficulties. He is not having any joint effusions.  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 Feels run down.   120/84.  Coughing and having a lot of congestion, some trouble breathing and nose is stopped up  Past Medical History, Surgical History, Social History, Family History, Problem List, Medications, and Allergies have been reviewed and updated if relevant.  Current Outpatient Prescriptions on File Prior to Visit  Medication Sig Dispense Refill  . aspirin 81 MG tablet Take 81 mg by mouth daily.        Marland Kitchen atorvastatin (LIPITOR) 10 MG tablet TAKE  ONE TABLET BY MOUTH NIGHTLY AT BEDTIME  30 tablet  4  . DIOVAN 80 MG tablet TAKE  ONE TABLET BY MOUTH NIGHTLY AT BEDTIME  30 each  6  . NEXIUM 40 MG capsule TAKE ONE CAPSULE BY MOUTH ONE TIME DAILY  30 each  6  . valACYclovir (VALTREX) 1000 MG tablet TAKE HALF TABLET BY MOUTH DAILY   15 tablet  5    Review of Systems: As above. No fever, chills, or sweats. Some chest pain. No nausea, vomiting, or diarrhea.  Physical Examination: Filed Vitals:   07/31/12 1241  BP: 130/84  Pulse: 75  Temp: 97.6 F (36.4 C)   Filed Vitals:   07/31/12 1241  Height: 5\' 9"  (1.753 m)  Weight: 240 lb (108.863 kg)   Body mass index is 35.44 kg/(m^2). Ideal Body Weight: Weight in (lb) to have BMI = 25: 168.9    GEN: WDWN, NAD, Non-toxic, A & O x 3 HEENT: Atraumatic, Normocephalic. Neck supple. No masses, No LAD. Ears and Nose: No external deformity. CV: RRR, No M/G/R. No JVD. No thrill. No extra heart sounds. PULM: CTA B, no wheezes, crackles, rhonchi. No retractions. No resp. distress. No accessory muscle use. EXTR: No c/c/e NEURO Normal gait.  PSYCH: Normally interactive. Conversant. Not depressed or anxious appearing.  Calm demeanor.    Assessment and Plan:  1. Fatigue  Basic metabolic panel, CBC with Differential, Hepatic function panel, TSH, Lyme disease, western blot [[facility]]  2. Special screening for malignant neoplasm of prostate  PSA  3. Other and unspecified hyperlipidemia  LDL cholesterol, direct  4. Arthralgia  Lyme disease, western blot [[facility]]   Overall not feeling well. He is normotensive in the office. He certainly does need to use a large  blood pressure cuff for accuracy. I am suspicious that the wrists blood pressure readings that he got at his work were not accurate since the paramedics also had normal low-pressure readings. I have asked him to monitor them at home every day, and get back with his primary care provider if readings are elevated.  I am going to check a Western blot Lyme test to definitively answer the question whether or not my diseases at play.  Also check basic laboratories and follow up with PCP for full physical  Orders Today:  Orders Placed This Encounter  Procedures  . Basic metabolic panel  . CBC with Differential  . Hepatic  function panel  . TSH  . PSA  . LDL cholesterol, direct  . Lyme disease, western blot [[facility]]    Medications Today: (Includes new updates added during medication reconciliation) No orders of the defined types were placed in this encounter.    Medications Discontinued: There are no discontinued medications.   Hannah Beat, MD

## 2012-08-02 LAB — LYME DISEASE, WESTERN BLOT
IgG P30 Ab.: ABSENT
IgG P39 Ab.: ABSENT
IgG P45 Ab.: ABSENT
IgG P58 Ab.: ABSENT
IgM P23 Ab.: ABSENT
Lyme IgG Wb: NEGATIVE
Lyme IgM Wb: NEGATIVE

## 2012-08-05 ENCOUNTER — Encounter: Payer: Self-pay | Admitting: *Deleted

## 2012-08-26 ENCOUNTER — Other Ambulatory Visit: Payer: Self-pay | Admitting: Family Medicine

## 2012-08-26 DIAGNOSIS — E7800 Pure hypercholesterolemia, unspecified: Secondary | ICD-10-CM

## 2012-08-28 ENCOUNTER — Other Ambulatory Visit (INDEPENDENT_AMBULATORY_CARE_PROVIDER_SITE_OTHER): Payer: Commercial Managed Care - PPO

## 2012-08-28 DIAGNOSIS — E7800 Pure hypercholesterolemia, unspecified: Secondary | ICD-10-CM

## 2012-08-29 LAB — GLUCOSE, RANDOM: Glucose, Bld: 95 mg/dL (ref 70–99)

## 2012-08-29 LAB — LIPID PANEL: HDL: 27.6 mg/dL — ABNORMAL LOW (ref >39.00)

## 2012-09-03 ENCOUNTER — Other Ambulatory Visit: Payer: Commercial Managed Care - PPO

## 2012-09-10 ENCOUNTER — Encounter: Payer: Commercial Managed Care - PPO | Admitting: Family Medicine

## 2012-09-30 ENCOUNTER — Ambulatory Visit (INDEPENDENT_AMBULATORY_CARE_PROVIDER_SITE_OTHER): Payer: Commercial Managed Care - PPO | Admitting: Family Medicine

## 2012-09-30 ENCOUNTER — Encounter: Payer: Self-pay | Admitting: Family Medicine

## 2012-09-30 VITALS — BP 144/90 | HR 68 | Temp 98.0°F | Ht 69.5 in | Wt 267.0 lb

## 2012-09-30 DIAGNOSIS — Z0000 Encounter for general adult medical examination without abnormal findings: Secondary | ICD-10-CM

## 2012-09-30 DIAGNOSIS — B009 Herpesviral infection, unspecified: Secondary | ICD-10-CM

## 2012-09-30 DIAGNOSIS — K219 Gastro-esophageal reflux disease without esophagitis: Secondary | ICD-10-CM

## 2012-09-30 DIAGNOSIS — Z1211 Encounter for screening for malignant neoplasm of colon: Secondary | ICD-10-CM

## 2012-09-30 DIAGNOSIS — I10 Essential (primary) hypertension: Secondary | ICD-10-CM

## 2012-09-30 DIAGNOSIS — E785 Hyperlipidemia, unspecified: Secondary | ICD-10-CM

## 2012-09-30 MED ORDER — ESOMEPRAZOLE MAGNESIUM 40 MG PO CPDR: DELAYED_RELEASE_CAPSULE | ORAL | Status: DC

## 2012-09-30 MED ORDER — VALSARTAN 80 MG PO TABS: ORAL_TABLET | ORAL | Status: DC

## 2012-09-30 MED ORDER — EPINEPHRINE 0.3 MG/0.3ML IJ DEVI: 0.3000 mg | Freq: Once | INTRAMUSCULAR | Status: DC

## 2012-09-30 MED ORDER — ATORVASTATIN CALCIUM 10 MG PO TABS: ORAL_TABLET | ORAL | Status: DC

## 2012-09-30 MED ORDER — VALACYCLOVIR HCL 1 G PO TABS: ORAL_TABLET | ORAL | Status: DC

## 2012-09-30 NOTE — Patient Instructions (Addendum)
I would get a flu shot each fall.   Take care.  Try to walk more.  Recheck labs in 1 year.   Glad to see you.

## 2012-09-30 NOTE — Progress Notes (Signed)
CPE- See plan.  Routine anticipatory guidance given to patient.  See health maintenance. Tetanus 2005 Flu shot done at work, will get later in 10/13.  PNA shot 2009 D/w patient [id number] for colon cancer screening, including IFOB vs. colonoscopy.  Risks and benefits of both were discussed and patient voiced understanding.  Pt elects for: IFOB.   PSA recently wnl.  Advance directive discussed, wife is designated if incapacitated.   Hypertension:    Using medication without problems or lightheadedness: yes Chest pain with exertion:no Edema:no Short of breath:no Average home BPs: usually 120s/80s Limited exericise  Elevated Cholesterol: Using medications without problems:yes Muscle aches: no Diet compliance: discussed.   Exercise: discussed.    GERD, controlled with current meds. No heartburn.  No ADE from medicine.   Oral HSV.  Controlled with rare flares. No ADE from medicine.   PMH and SH reviewed  Meds, vitals, and allergies reviewed.   ROS: See HPI.  Otherwise negative.    GEN: nad, alert and oriented HEENT: mucous membranes moist NECK: supple w/o LA CV: rrr. PULM: ctab, no inc wob ABD: soft, +bs EXT: no edema SKIN: no acute rash

## 2012-10-01 DIAGNOSIS — Z0000 Encounter for general adult medical examination without abnormal findings: Secondary | ICD-10-CM | POA: Insufficient documentation

## 2012-10-01 NOTE — Assessment & Plan Note (Signed)
Usually controlled, continue current meds and work on diet/weight/exercise.

## 2012-10-01 NOTE — Assessment & Plan Note (Signed)
Routine anticipatory guidance given to patient.  See health maintenance. Tetanus 2005 Flu shot done at work, will get later in 10/13.  PNA shot 2009 D/w patient [id number] for colon cancer screening, including IFOB vs. colonoscopy.  Risks and benefits of both were discussed and patient voiced understanding.  Pt elects for: IFOB.   PSA recently wnl.  Advance directive discussed, wife is designated if incapacitated.

## 2012-10-01 NOTE — Assessment & Plan Note (Signed)
Continue valtrex, doing well.

## 2012-10-01 NOTE — Assessment & Plan Note (Signed)
Sx controlled on current meds. continue current meds and work on diet/weight/exercise.

## 2012-10-01 NOTE — Assessment & Plan Note (Signed)
Labs d/w pt. Continue current meds and work on diet/weight/exercise.

## 2013-08-08 ENCOUNTER — Ambulatory Visit (INDEPENDENT_AMBULATORY_CARE_PROVIDER_SITE_OTHER): Payer: Commercial Managed Care - PPO | Admitting: Family Medicine

## 2013-08-08 ENCOUNTER — Encounter: Payer: Self-pay | Admitting: Family Medicine

## 2013-08-08 VITALS — BP 130/78 | HR 72 | Temp 98.1°F | Wt 232.2 lb

## 2013-08-08 DIAGNOSIS — F410 Panic disorder [episodic paroxysmal anxiety] without agoraphobia: Secondary | ICD-10-CM | POA: Insufficient documentation

## 2013-08-08 DIAGNOSIS — J020 Streptococcal pharyngitis: Secondary | ICD-10-CM

## 2013-08-08 DIAGNOSIS — J069 Acute upper respiratory infection, unspecified: Secondary | ICD-10-CM

## 2013-08-08 MED ORDER — LIDOCAINE VISCOUS 2 % MT SOLN: 10.0000 mL | OROMUCOSAL | Status: DC | PRN

## 2013-08-08 NOTE — Progress Notes (Signed)
Sx started a few days, ST noted.  Mild drainage but the ST is the main issue.  No fevers.   Rhinorrhea: minimal congestion:minimal ear pain: no sore throat:yes Cough: minimal Myalgias: no  Sunday was in the woods, possible insect bites.  Was itching at the bite sites, better today.  He though this was not related to the above.    ROS: See HPI.  Otherwise negative.    Meds, vitals, and allergies reviewed.   GEN: nad, alert and oriented HEENT: mucous membranes moist, TM w/o erythema, nasal epithelium injected, OP with cobblestoning NECK: supple w/o LA CV: rrr. PULM: ctab, no inc wob ABD: soft, +bs EXT: no edema RST neg

## 2013-08-08 NOTE — Assessment & Plan Note (Signed)
On the way out- OBTW- likely panic sx episodically for the last year.  Asked pt to track sx, stressors, inciting events and then notify me.  He agrees.

## 2013-08-08 NOTE — Patient Instructions (Addendum)
Use the lidocaine for your sore throat.   Drink plenty of fluids, take tylenol as needed, and gargle with warm salt water for your throat.  This should gradually improve.  Take care.  Let us know if you have other concerns.

## 2013-08-08 NOTE — Assessment & Plan Note (Signed)
rst neg, likely viral.  Supportive tx.  Nontoxic.  Fu prn. See instructions.

## 2013-10-01 ENCOUNTER — Other Ambulatory Visit: Payer: Self-pay | Admitting: Family Medicine

## 2013-10-02 ENCOUNTER — Other Ambulatory Visit: Payer: Self-pay | Admitting: *Deleted

## 2013-10-02 NOTE — Telephone Encounter (Signed)
Schedule a CPE.  rx sent.

## 2013-10-02 NOTE — Telephone Encounter (Signed)
Electronic refill request. Patient due for CPE.  Please advise.

## 2013-10-03 NOTE — Telephone Encounter (Signed)
Patient advised.   Will call back on his lunch break to schedule CPE

## 2014-01-05 ENCOUNTER — Other Ambulatory Visit: Payer: Self-pay | Admitting: Family Medicine

## 2014-01-21 ENCOUNTER — Encounter: Payer: Self-pay | Admitting: Family Medicine

## 2014-01-21 ENCOUNTER — Ambulatory Visit (INDEPENDENT_AMBULATORY_CARE_PROVIDER_SITE_OTHER): Payer: Commercial Managed Care - PPO | Admitting: Family Medicine

## 2014-01-21 VITALS — BP 140/94 | HR 68 | Temp 98.5°F | Wt 238.0 lb

## 2014-01-21 DIAGNOSIS — J069 Acute upper respiratory infection, unspecified: Secondary | ICD-10-CM

## 2014-01-21 MED ORDER — BENZONATATE 200 MG PO CAPS: 200.0000 mg | ORAL_CAPSULE | Freq: Three times a day (TID) | ORAL | Status: DC | PRN

## 2014-01-21 NOTE — Assessment & Plan Note (Signed)
Likely viral, nontoxic, supportive care. F/u prn.  He agrees.

## 2014-01-21 NOTE — Progress Notes (Signed)
Pre-visit discussion using our clinic review tool. No additional management support is needed unless otherwise documented below in the visit note.  Sx started about 3-4 days ago.  Started with cough, sneezing. Nose burning. Sx worse for 2 days, some better today.  Now with hoarse voice today.  No fevers. No vomiting, no diarrhea.  No rash.  No ear pain but some popping.  Cough is better now.  Chest can burn with a cough.  Likely mult sick contacts at work.   Meds, vitals, and allergies reviewed.   ROS: See HPI.  Otherwise, noncontributory.  GEN: nad, alert and oriented HEENT: mucous membranes moist, tm w/o erythema, nasal exam w/o erythema, clear discharge noted,  OP with cobblestoning NECK: supple w/o LA CV: rrr.   PULM: ctab, no inc wob EXT: no edema SKIN: no acute rash

## 2014-01-21 NOTE — Patient Instructions (Signed)
Drink plenty of fluids, take tylenol as needed, and gargle with warm salt water for your throat.  This should gradually improve.  Take care.  Let us know if you have other concerns.   Take tessalon for the cough.

## 2014-02-13 ENCOUNTER — Other Ambulatory Visit: Payer: Self-pay | Admitting: Family Medicine

## 2014-02-13 DIAGNOSIS — I10 Essential (primary) hypertension: Secondary | ICD-10-CM

## 2014-02-16 ENCOUNTER — Other Ambulatory Visit (INDEPENDENT_AMBULATORY_CARE_PROVIDER_SITE_OTHER): Payer: Commercial Managed Care - PPO

## 2014-02-16 ENCOUNTER — Telehealth: Payer: Self-pay | Admitting: Family Medicine

## 2014-02-16 DIAGNOSIS — I10 Essential (primary) hypertension: Secondary | ICD-10-CM

## 2014-02-16 LAB — COMPREHENSIVE METABOLIC PANEL
ALK PHOS: 76 U/L (ref 39–117)
ALT: 22 U/L (ref 0–53)
AST: 17 U/L (ref 0–37)
Albumin: 4.2 g/dL (ref 3.5–5.2)
BILIRUBIN TOTAL: 0.7 mg/dL (ref 0.3–1.2)
BUN: 15 mg/dL (ref 6–23)
CO2: 27 meq/L (ref 19–32)
CREATININE: 1.1 mg/dL (ref 0.4–1.5)
Calcium: 9.6 mg/dL (ref 8.4–10.5)
Chloride: 104 mEq/L (ref 96–112)
GFR: 78 mL/min (ref >60.00)
GLUCOSE: 97 mg/dL (ref 70–99)
Potassium: 3.9 mEq/L (ref 3.5–5.1)
Sodium: 137 mEq/L (ref 135–145)
Total Protein: 7.7 g/dL (ref 6.0–8.3)

## 2014-02-16 LAB — LIPID PANEL
CHOLESTEROL: 176 mg/dL (ref 0–200)
HDL: 26.4 mg/dL — AB (ref >39.00)
LDL CALC: 100 mg/dL — AB (ref 0–99)
Total CHOL/HDL Ratio: 7
Triglycerides: 250 mg/dL — ABNORMAL HIGH (ref 0.0–149.0)
VLDL: 50 mg/dL — AB (ref 0.0–40.0)

## 2014-02-16 NOTE — Telephone Encounter (Signed)
Relevant patient education assigned to patient using Emmi. ° °

## 2014-02-27 ENCOUNTER — Encounter: Payer: Self-pay | Admitting: Family Medicine

## 2014-02-27 ENCOUNTER — Ambulatory Visit (INDEPENDENT_AMBULATORY_CARE_PROVIDER_SITE_OTHER): Payer: Commercial Managed Care - PPO | Admitting: Family Medicine

## 2014-02-27 VITALS — BP 142/92 | HR 77 | Temp 97.6°F | Ht 69.5 in | Wt 241.2 lb

## 2014-02-27 DIAGNOSIS — Z1211 Encounter for screening for malignant neoplasm of colon: Secondary | ICD-10-CM

## 2014-02-27 DIAGNOSIS — F410 Panic disorder [episodic paroxysmal anxiety] without agoraphobia: Secondary | ICD-10-CM

## 2014-02-27 DIAGNOSIS — Z0000 Encounter for general adult medical examination without abnormal findings: Secondary | ICD-10-CM

## 2014-02-27 DIAGNOSIS — I10 Essential (primary) hypertension: Secondary | ICD-10-CM

## 2014-02-27 DIAGNOSIS — E669 Obesity, unspecified: Secondary | ICD-10-CM

## 2014-02-27 DIAGNOSIS — Z23 Encounter for immunization: Secondary | ICD-10-CM

## 2014-02-27 DIAGNOSIS — E785 Hyperlipidemia, unspecified: Secondary | ICD-10-CM

## 2014-02-27 MED ORDER — VALACYCLOVIR HCL 1 G PO TABS: 500.0000 mg | ORAL_TABLET | Freq: Every day | ORAL | Status: DC

## 2014-02-27 MED ORDER — EPINEPHRINE 0.3 MG/0.3ML IJ SOAJ: 0.3000 mg | Freq: Once | INTRAMUSCULAR | Status: DC

## 2014-02-27 MED ORDER — VALSARTAN 80 MG PO TABS: ORAL_TABLET | ORAL | Status: DC

## 2014-02-27 MED ORDER — ESOMEPRAZOLE MAGNESIUM 40 MG PO CPDR
DELAYED_RELEASE_CAPSULE | ORAL | Status: DC
Start: 2014-02-27 — End: 2014-04-01

## 2014-02-27 MED ORDER — ATORVASTATIN CALCIUM 10 MG PO TABS: ORAL_TABLET | ORAL | Status: DC

## 2014-02-27 NOTE — Progress Notes (Signed)
Pre visit review using our clinic review tool, if applicable. No additional management support is needed unless otherwise documented below in the visit note.  CPE- See plan.  Routine anticipatory guidance given to patient.  See health maintenance. Tetanus 2015 Flu shot prev done. Done at work.  Shingles and PNA shots not due.  D/w patient [id number] for colon cancer screening, including IFOB vs. colonoscopy.  Risks and benefits of both were discussed and patient voiced understanding.  Pt elects for: IFOB.   Prostate cancer screening and PSA options (with potential risks and benefits of testing vs not testing) were discussed along with recent recs/guidelines.  He declined testing PSA at this point.  Living will d/w pt.  Wife would be designated if incapacitated.   Diet and exercise d/w pt. Encouraged both.  Limited by occ R knee pain.  He'll occ use a brace.  No trauma. Can be worse with weather changes.    Hypertension:    Using medication without problems or lightheadedness: yes Chest pain with exertion:no Edema:no Short of breath:no  Elevated Cholesterol: Using medications without problems: yes Muscle aches: no Diet compliance:see above Exercise: see above  Panic sx.  With unknown places, ie travel.  Worse in crowds.  Claustrophobia noted.  Some better recently, he'll consider counseling.  No SI/HI.   PMH and SH reviewed  Meds, vitals, and allergies reviewed.   ROS: See HPI.  Otherwise negative.    GEN: nad, alert and oriented HEENT: mucous membranes moist NECK: supple w/o LA CV: rrr. PULM: ctab, no inc wob ABD: soft, +bs EXT: no edema SKIN: no acute rash

## 2014-02-27 NOTE — Patient Instructions (Signed)
Go to the lab on the way out.  We'll contact you with your lab report. Take care.  If you want to go to counseling, then let me know.  Recheck in about 1 year.  Glad to see you.

## 2014-03-01 DIAGNOSIS — E669 Obesity, unspecified: Secondary | ICD-10-CM | POA: Insufficient documentation

## 2014-03-01 NOTE — Assessment & Plan Note (Signed)
D/w pt.  Goal for weight loss.  Wouldn't change meds at this point. Labs d/w pt.  

## 2014-03-01 NOTE — Assessment & Plan Note (Signed)
D/w pt.  Goal for weight loss.  Wouldn't change meds at this point. Labs d/w pt.

## 2014-03-01 NOTE — Assessment & Plan Note (Signed)
Some better recently, he'll consider counseling.

## 2014-03-01 NOTE — Assessment & Plan Note (Signed)
Routine anticipatory guidance given to patient.  See health maintenance. Tetanus 2015 Flu shot prev done. Done at work.  Shingles and PNA shots not due.  D/w patient [id number] for colon cancer screening, including IFOB vs. colonoscopy.  Risks and benefits of both were discussed and patient voiced understanding.  Pt elects for: IFOB.   Prostate cancer screening and PSA options (with potential risks and benefits of testing vs not testing) were discussed along with recent recs/guidelines.  He declined testing PSA at this point.  Living will d/w pt.  Wife would be designated if incapacitated.   Diet and exercise d/w pt. Encouraged both.  Limited by occ R knee pain.  He'll occ use a brace.  No trauma. Can be worse with weather changes.

## 2014-03-02 ENCOUNTER — Telehealth: Payer: Self-pay | Admitting: Family Medicine

## 2014-03-02 NOTE — Telephone Encounter (Signed)
Relevant patient education assigned to patient using Emmi. ° °

## 2014-04-01 ENCOUNTER — Other Ambulatory Visit: Payer: Self-pay | Admitting: *Deleted

## 2014-04-01 MED ORDER — ATORVASTATIN CALCIUM 10 MG PO TABS: ORAL_TABLET | ORAL | Status: DC

## 2014-04-01 MED ORDER — VALSARTAN 80 MG PO TABS: ORAL_TABLET | ORAL | Status: DC

## 2014-04-01 MED ORDER — VALACYCLOVIR HCL 1 G PO TABS: 500.0000 mg | ORAL_TABLET | Freq: Every day | ORAL | Status: DC

## 2014-04-01 MED ORDER — ESOMEPRAZOLE MAGNESIUM 40 MG PO CPDR: DELAYED_RELEASE_CAPSULE | ORAL | Status: DC

## 2015-01-01 ENCOUNTER — Ambulatory Visit (INDEPENDENT_AMBULATORY_CARE_PROVIDER_SITE_OTHER): Payer: Commercial Managed Care - PPO | Admitting: Family Medicine

## 2015-01-01 ENCOUNTER — Encounter: Payer: Self-pay | Admitting: Family Medicine

## 2015-01-01 VITALS — BP 138/90 | HR 72 | Temp 98.6°F | Wt 239.5 lb

## 2015-01-01 DIAGNOSIS — I10 Essential (primary) hypertension: Secondary | ICD-10-CM

## 2015-01-01 MED ORDER — VALSARTAN 80 MG PO TABS: ORAL_TABLET | ORAL | Status: DC

## 2015-01-01 NOTE — Patient Instructions (Signed)
Increase to 2 diovan a day.  Check your BP a few times in the meantime.  Recheck labs in about 7-10 days- get a lab appointment scheduled.  Update me with your BP at that point.  Take care.  Glad to see you.

## 2015-01-01 NOTE — Progress Notes (Signed)
Pre visit review using our clinic review tool, if applicable. No additional management support is needed unless otherwise documented below in the visit note.  He had checked BP at home, up to 158/110.   Checked BP at home 149/100 before coming to clinic.  He has checked it on mult cuffs and both were up.   He had noted the inc in the last week or so.   No CP, SOB, BLE edema.  Had a HA when BP was up.  Some knee aches.   No more salt in diet.  His mother has colon cancer and is not doing well.  He found about that recently.    Meds, vitals, and allergies reviewed.   ROS: See HPI.  Otherwise, noncontributory.  GEN: nad, alert and oriented HEENT: mucous membranes moist NECK: supple w/o LA CV: rrr.  no murmur PULM: ctab, no inc wob EXT: no edema

## 2015-01-03 NOTE — Assessment & Plan Note (Signed)
Condolences offered re: his mother's illness.  He thanked me.  Increase to 160 diovan a day. He'll check BP a few times in the meantime.  Recheck labs in about 7-10 days- he'll get a lab appointment scheduled and he'll update me with BP at that point.  He agrees.

## 2015-01-15 ENCOUNTER — Other Ambulatory Visit (INDEPENDENT_AMBULATORY_CARE_PROVIDER_SITE_OTHER): Payer: Commercial Managed Care - PPO

## 2015-01-15 DIAGNOSIS — I10 Essential (primary) hypertension: Secondary | ICD-10-CM

## 2015-01-15 LAB — BASIC METABOLIC PANEL
BUN: 19 mg/dL (ref 6–23)
CHLORIDE: 105 meq/L (ref 96–112)
CO2: 25 meq/L (ref 19–32)
CREATININE: 1.15 mg/dL (ref 0.40–1.50)
Calcium: 9.7 mg/dL (ref 8.4–10.5)
GFR: 70.75 mL/min (ref >60.00)
GLUCOSE: 109 mg/dL — AB (ref 70–99)
POTASSIUM: 4 meq/L (ref 3.5–5.1)
Sodium: 140 mEq/L (ref 135–145)

## 2015-01-17 ENCOUNTER — Other Ambulatory Visit: Payer: Self-pay | Admitting: Family Medicine

## 2015-01-17 MED ORDER — VALSARTAN 160 MG PO TABS: 160.0000 mg | ORAL_TABLET | Freq: Every day | ORAL | Status: DC

## 2015-01-18 ENCOUNTER — Encounter: Payer: Self-pay | Admitting: *Deleted

## 2015-02-25 ENCOUNTER — Other Ambulatory Visit: Payer: Self-pay | Admitting: Family Medicine

## 2015-10-15 ENCOUNTER — Ambulatory Visit (INDEPENDENT_AMBULATORY_CARE_PROVIDER_SITE_OTHER): Payer: Commercial Managed Care - PPO | Admitting: Family Medicine

## 2015-10-15 ENCOUNTER — Encounter: Payer: Self-pay | Admitting: Family Medicine

## 2015-10-15 VITALS — BP 136/92 | HR 71 | Temp 98.0°F | Wt 245.4 lb

## 2015-10-15 DIAGNOSIS — R1031 Right lower quadrant pain: Secondary | ICD-10-CM

## 2015-10-15 DIAGNOSIS — Z23 Encounter for immunization: Secondary | ICD-10-CM | POA: Diagnosis not present

## 2015-10-15 NOTE — Progress Notes (Signed)
Pre visit review using our clinic review tool, if applicable. No additional management support is needed unless otherwise documented below in the visit note.  Was working hard, shoveling rocks.  Pain in R groin, pain with a cough, pain in the scrotum.  No lumps or masses noted.  No rash.  No pain with urination.  No known h/o hernia.  Pain is better with rest. More pain with lifting.  Better today than prev.  Can walk w/o a lot of pain.  No L sided pain.    Meds, vitals, and allergies reviewed.   ROS: See HPI.  Otherwise, noncontributory.  nad ncat rrr ctab abd soft, not ttp Slight fullness in the R inguinal area but doesn't move with cough, manipulation.  No definite hernia felt, no discreet mass.  Testicles not ttp. Chronic enlargement of the R spermatic cord noted, not ttp.   Distal/medial R oblique ttp, rectus not ttp No L sided abd pain.  No rash, no bruising.

## 2015-10-15 NOTE — Patient Instructions (Addendum)
You can take ibuprofen 200 mg per tabs, up to 3 tabs 3 times a day with food for a few days.   Likely a muscle pull.  Should gradually resolve.   Try to take it easy in the meantime.  Update me if you notice a lump or a bulge (like a hernia). Take care.  Glad to see you.  Update me if your BP stays up.   Schedule a physical for the spring of 2017.

## 2015-10-16 DIAGNOSIS — K4090 Unilateral inguinal hernia, without obstruction or gangrene, not specified as recurrent: Secondary | ICD-10-CM | POA: Insufficient documentation

## 2015-10-16 DIAGNOSIS — R1030 Lower abdominal pain, unspecified: Secondary | ICD-10-CM | POA: Insufficient documentation

## 2015-10-16 NOTE — Assessment & Plan Note (Signed)
Likely oblique strain, better today than prev.  No mass/hernia felt, dw pt about anatomy.  Update me as needed.  Rest in the meantime.  He'll check BP out of clinic and update me if chronically elevated.   Continue as is for now.  See AVS.

## 2015-10-26 ENCOUNTER — Telehealth: Payer: Self-pay | Admitting: Family Medicine

## 2015-10-26 DIAGNOSIS — I10 Essential (primary) hypertension: Secondary | ICD-10-CM

## 2015-10-26 NOTE — Telephone Encounter (Signed)
Patient has an appointment for a physical in March.  Patient has to have a physical done before 12/02/15 in order to get $10 off per pay.  Can patient come in for a physical before 12/02/15?

## 2015-10-27 NOTE — Addendum Note (Signed)
Addended by: Tonia Ghent on: 10/27/2015 10:09 AM   Modules accepted: Orders

## 2015-10-27 NOTE — Telephone Encounter (Addendum)
Use a 30 min slot, just not this week. Please not on a Monday or Friday.  Thanks.  Lab visit ahead of time.  I put in the orders.

## 2015-11-21 ENCOUNTER — Other Ambulatory Visit: Payer: Self-pay | Admitting: Family Medicine

## 2015-11-22 ENCOUNTER — Other Ambulatory Visit (INDEPENDENT_AMBULATORY_CARE_PROVIDER_SITE_OTHER): Payer: Commercial Managed Care - PPO

## 2015-11-22 DIAGNOSIS — I10 Essential (primary) hypertension: Secondary | ICD-10-CM | POA: Diagnosis not present

## 2015-11-22 LAB — LIPID PANEL
CHOLESTEROL: 208 mg/dL — AB (ref 0–200)
HDL: 24.4 mg/dL — ABNORMAL LOW (ref >39.00)
NonHDL: 184.03
Total CHOL/HDL Ratio: 9
Triglycerides: 245 mg/dL — ABNORMAL HIGH (ref 0.0–149.0)
VLDL: 49 mg/dL — ABNORMAL HIGH (ref 0.0–40.0)

## 2015-11-22 LAB — COMPREHENSIVE METABOLIC PANEL
ALBUMIN: 4.4 g/dL (ref 3.5–5.2)
ALK PHOS: 84 U/L (ref 39–117)
ALT: 21 U/L (ref 0–53)
AST: 15 U/L (ref 0–37)
BILIRUBIN TOTAL: 0.5 mg/dL (ref 0.2–1.2)
BUN: 15 mg/dL (ref 6–23)
CO2: 28 mEq/L (ref 19–32)
CREATININE: 1.01 mg/dL (ref 0.40–1.50)
Calcium: 9.9 mg/dL (ref 8.4–10.5)
Chloride: 104 mEq/L (ref 96–112)
GFR: 81.92 mL/min (ref >60.00)
GLUCOSE: 103 mg/dL — AB (ref 70–99)
POTASSIUM: 4.3 meq/L (ref 3.5–5.1)
SODIUM: 140 meq/L (ref 135–145)
TOTAL PROTEIN: 7.4 g/dL (ref 6.0–8.3)

## 2015-11-22 LAB — LDL CHOLESTEROL, DIRECT: LDL DIRECT: 138 mg/dL

## 2015-11-24 ENCOUNTER — Ambulatory Visit (INDEPENDENT_AMBULATORY_CARE_PROVIDER_SITE_OTHER): Payer: Commercial Managed Care - PPO | Admitting: Family Medicine

## 2015-11-24 ENCOUNTER — Encounter: Payer: Self-pay | Admitting: Family Medicine

## 2015-11-24 VITALS — BP 140/88 | HR 80 | Temp 98.5°F | Ht 69.5 in | Wt 242.8 lb

## 2015-11-24 DIAGNOSIS — E785 Hyperlipidemia, unspecified: Secondary | ICD-10-CM

## 2015-11-24 DIAGNOSIS — Z0000 Encounter for general adult medical examination without abnormal findings: Secondary | ICD-10-CM | POA: Diagnosis not present

## 2015-11-24 DIAGNOSIS — K219 Gastro-esophageal reflux disease without esophagitis: Secondary | ICD-10-CM

## 2015-11-24 DIAGNOSIS — Z119 Encounter for screening for infectious and parasitic diseases, unspecified: Secondary | ICD-10-CM

## 2015-11-24 DIAGNOSIS — B009 Herpesviral infection, unspecified: Secondary | ICD-10-CM

## 2015-11-24 DIAGNOSIS — I10 Essential (primary) hypertension: Secondary | ICD-10-CM

## 2015-11-24 DIAGNOSIS — R1031 Right lower quadrant pain: Secondary | ICD-10-CM

## 2015-11-24 MED ORDER — VALACYCLOVIR HCL 500 MG PO TABS: 500.0000 mg | ORAL_TABLET | Freq: Every day | ORAL | Status: DC

## 2015-11-24 MED ORDER — ATORVASTATIN CALCIUM 10 MG PO TABS: ORAL_TABLET | ORAL | Status: DC

## 2015-11-24 MED ORDER — VALSARTAN 160 MG PO TABS: 160.0000 mg | ORAL_TABLET | Freq: Every day | ORAL | Status: DC

## 2015-11-24 MED ORDER — ESOMEPRAZOLE MAGNESIUM 40 MG PO CPDR: 40.0000 mg | DELAYED_RELEASE_CAPSULE | Freq: Every day | ORAL | Status: DC

## 2015-11-24 NOTE — Patient Instructions (Signed)
Take care.  Glad to see you.  Call back when possible about getting a referral for the colonoscopy.

## 2015-11-24 NOTE — Progress Notes (Signed)
Pre visit review using our clinic review tool, if applicable. No additional management support is needed unless otherwise documented below in the visit note.  CPE- See plan.  Routine anticipatory guidance given to patient.  See health maintenance. Tetanus 2015 Flu 2016 PNA 2009 Shingles not due Colon cancer screening d/w pt.  He'll call back and we can schedule/refer for colonoscopy.   Prostate cancer screening and PSA options (with potential risks and benefits of testing vs not testing) were discussed along with recent recs/guidelines.  He declined testing PSA at this point. Living will d/w pt.  Wife designated if patient were incapacitated.   Diet and exercise d/w pt.  Pt opts in for HCV screening.  D/w pt re: routine screening.   HIV prev neg in ~1996 per patient report.   Hypertension:    Using medication without problems or lightheadedness: yes Chest pain with exertion:no Edema:no Short of breath:no  Elevated Cholesterol: Using medications without problems:yes Muscle aches: no Diet compliance: "not to good."  Exercise: encouraged.   Complicated by caring for mother who is terminally ill.    HSV suppression with valtrex.  Wants to continue.  No recent events.    Prev groin pain is getting better slowly.   PMH and SH reviewed  Meds, vitals, and allergies reviewed.   ROS: See HPI.  Otherwise negative.    GEN: nad, alert and oriented HEENT: mucous membranes moist NECK: supple w/o LA CV: rrr. PULM: ctab, no inc wob ABD: soft, +bs EXT: no edema SKIN: no acute rash

## 2015-11-25 NOTE — Assessment & Plan Note (Signed)
Tetanus 2015 Flu 2016 PNA 2009 Shingles not due Colon cancer screening d/w pt.  He'll call back and we can schedule/refer for colonoscopy.   Prostate cancer screening and PSA options (with potential risks and benefits of testing vs not testing) were discussed along with recent recs/guidelines.  He declined testing PSA at this point. Living will d/w pt.  Wife designated if patient were incapacitated.   Diet and exercise d/w pt.  Pt opts in for HCV screening.  D/w pt re: routine screening.   HIV prev neg in ~1996 per patient report.

## 2015-11-25 NOTE — Assessment & Plan Note (Signed)
Improved, likely was from a groin strain.

## 2015-11-25 NOTE — Assessment & Plan Note (Signed)
Controlled with PPI.  He'll work on diet and exercise as much as possible.

## 2015-11-25 NOTE — Assessment & Plan Note (Signed)
He's doing the best he can caring for his mother.  That takes precedent over other concerns.  He'll work on diet and exercise when possible.  D/w pt. Continue statin.

## 2015-11-25 NOTE — Assessment & Plan Note (Signed)
Continue valtex suppression.

## 2015-11-25 NOTE — Assessment & Plan Note (Signed)
Reasonable control, he'll try to work on diet and exercise.  No change in meds.

## 2016-02-28 ENCOUNTER — Other Ambulatory Visit: Payer: Commercial Managed Care - PPO

## 2016-03-03 ENCOUNTER — Encounter: Payer: Commercial Managed Care - PPO | Admitting: Family Medicine

## 2016-06-22 ENCOUNTER — Ambulatory Visit (INDEPENDENT_AMBULATORY_CARE_PROVIDER_SITE_OTHER): Payer: Commercial Managed Care - PPO | Admitting: Family Medicine

## 2016-06-22 ENCOUNTER — Ambulatory Visit (INDEPENDENT_AMBULATORY_CARE_PROVIDER_SITE_OTHER)
Admission: RE | Admit: 2016-06-22 | Discharge: 2016-06-22 | Disposition: A | Discharge status: Home / Self Care | Payer: Commercial Managed Care - PPO | Source: Ambulatory Visit | Attending: Family Medicine | Admitting: Family Medicine

## 2016-06-22 ENCOUNTER — Encounter: Payer: Self-pay | Admitting: Family Medicine

## 2016-06-22 ENCOUNTER — Ambulatory Visit: Admission: RE | Admit: 2016-06-22 | Payer: Commercial Managed Care - PPO | Source: Ambulatory Visit

## 2016-06-22 VITALS — BP 138/86 | HR 72 | Temp 98.5°F | Wt 253.8 lb

## 2016-06-22 DIAGNOSIS — M25562 Pain in left knee: Secondary | ICD-10-CM

## 2016-06-22 MED ORDER — MELOXICAM 15 MG PO TABS: 7.5000 mg | ORAL_TABLET | Freq: Every day | ORAL | Status: DC

## 2016-06-22 NOTE — Progress Notes (Signed)
Pre visit review using our clinic review tool, if applicable. No additional management support is needed unless otherwise documented below in the visit note.  L knee pain.  No injury.  Was getting worse then better then worse again, over the last few weeks.  No similar R knee pain.  Not red, puffy, bruised.  Pain is worse bending the knee.  Pain standing.  Better laying in bed.  No locking or clicking.  Can fully extend.  Frontal/medial pain.  TTP.  Tried ice with some relief.  Tried ibuprofen 400mg  a few times a day.  Minimal relief.    Meds, vitals, and allergies reviewed.   ROS: Per HPI unless specifically indicated in ROS section   nad L knee w/o bruising or redness but slightly puffy on medial side of knee, superior to joint line.   ttp medially but not laterally, patella not ttp Normal ROM ACL and meniscus feel intact on exam.

## 2016-06-22 NOTE — Patient Instructions (Addendum)
Go to the lab on the way out.  We'll contact you with your xray report. Stop ibuprofen. Keep icing and change to mobic.   If not better, then let me know.   We may need to go you over to sports medicine or PT.  Take care.  Glad to see you.

## 2016-06-23 DIAGNOSIS — M25562 Pain in left knee: Secondary | ICD-10-CM | POA: Insufficient documentation

## 2016-06-23 NOTE — Assessment & Plan Note (Signed)
Bursitis vs medial strain vs OA in ddx.  Knee feels intact o/w.  D/w pt.  See notes on imaging.   See AVS.  Change to mobic, stop ibuprofen.  He has OV with Dr. Lorelei Pont scheduled for next week. Will keep that OV for now.   He'll update me.  Work note given.

## 2016-06-28 ENCOUNTER — Ambulatory Visit (INDEPENDENT_AMBULATORY_CARE_PROVIDER_SITE_OTHER): Payer: Commercial Managed Care - PPO | Admitting: Family Medicine

## 2016-06-28 ENCOUNTER — Encounter: Payer: Self-pay | Admitting: Family Medicine

## 2016-06-28 VITALS — BP 140/98 | HR 66 | Temp 98.6°F | Ht 69.5 in | Wt 250.5 lb

## 2016-06-28 DIAGNOSIS — M25562 Pain in left knee: Secondary | ICD-10-CM

## 2016-06-28 MED ORDER — NAPROXEN 500 MG PO TABS: 500.0000 mg | ORAL_TABLET | Freq: Two times a day (BID) | ORAL | Status: DC

## 2016-06-28 NOTE — Progress Notes (Signed)
Dr. Frederico Hamman T. Almeda Ezra, MD, [facility] [place], [address] Phone: [phone] Fax: [phone]  06/28/2016  Patient: Erik Mitchell, MRN: [id number], DOB: 1961/02/17, 55 y.o.  Primary Physician:  Elsie Stain, MD   Chief Complaint  Patient presents with  . Knee Pain    Left x 2 weeks   Subjective:   Erik Mitchell is a 55 y.o. very pleasant male patient who presents with the following:  Pleasant gentleman who has been complaining of some left-sided knee pain, more in the medial compartment for about the last 2 weeks.  He had no distinct injury that he can recall.  Prior to this onset of the pain he did put out about 20 hours worth of mulch.  He has had is some mild swelling.  He has had pain with flexion and full extension.  He has been on some meloxicam since last week, and he thinks that his knee is getting somewhat better, but he has had some nausea since he started taking the meloxicam.  Hurts and aches, no known injury. No clear injury - put out 20 hours worth of mulch.   Past Medical History, Surgical History, Social History, Family History, Problem List, Medications, and Allergies have been reviewed and updated if relevant.  Patient Active Problem List   Diagnosis Date Noted  . Left knee pain 06/23/2016  . Inguinal pain 10/16/2015  . Obesity (BMI 30-39.9) 03/01/2014  . Panic 08/08/2013  . Routine general medical examination at a health care facility 10/01/2012  . Herpes simplex virus (HSV) infection 09/05/2007  . HYPERGLYCEMIA, 8/05 09/05/2007  . ALLERGIC RHINITIS 02/16/2003  . GERD 12/18/2002  . HLD (hyperlipidemia) 08/19/1995  . Essential hypertension 08/19/1995    Past Medical History  Diagnosis Date  . Hypertension 08/19/95  . Hyperlipemia 08/19/1995  . Allergic rhinitis 02/16/2003  . GERD (gastroesophageal reflux disease) 12/18/2002  . History of ETT 11/96    wnl, cardiology consult0 [facility]  01/97  . H/O cold sores     valtrex suppression  . Shingles     Past Surgical History  Procedure Laterality Date  . Tendon repair      R 4th finger  . Tonsillectomy      Social History   Social History  . Marital Status: Married    Spouse Name: N/A  . Number of Children: N/A  . Years of Education: N/A   Occupational History  . Tool Maker [employer]   Social History Main Topics  . Smoking status: Never Smoker   . Smokeless tobacco: Never Used  . Alcohol Use: 0.0 oz/week    0 Standard drinks or equivalent per week     Comment: very little  . Drug Use: No  . Sexual Activity: Yes   Other Topics Concern  . 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 Diabetes Mother   . Heart disease Mother   . Cancer Mother   . Colon cancer Mother     presumed  . Heart disease Father     MI CABG x 3 Pacer  . Diabetes Brother   . Heart disease Brother     MI x 2 Stents  . Prostate cancer Neg Hx  Allergies  Allergen Reactions  . Bee Venom     Swelling, short of breath    Medication list reviewed and updated in full in [place].  GEN: No fevers, chills. Nontoxic. Primarily MSK c/o today. MSK: Detailed in the HPI GI: tolerating PO intake without difficulty Neuro: No numbness, parasthesias, or tingling associated. Otherwise the pertinent positives of the ROS are noted above.   Objective:   BP 140/98 mmHg  Pulse 66  Temp(Src) 98.6 F (37 C) (Oral)  Ht 5' 9.5" (1.765 m)  Wt 250 lb 8 oz (113.626 kg)  BMI 36.47 kg/m2   GEN: WDWN, NAD, Non-toxic, Alert & Oriented x 3 HEENT: Atraumatic, Normocephalic.  Ears and Nose: No external deformity. EXTR: No clubbing/cyanosis/edema NEURO: Normal gait.  PSYCH: Normally interactive. Conversant. Not depressed or anxious appearing.  Calm  demeanor.   Knee:  L Gait: Normal heel toe pattern ROM: 0-120 Effusion: mild Echymosis or edema: none Patellar tendon NT Painful PLICA: neg Patellar grind: negative Medial and lateral patellar facet loading: negative medial and lateral joint lines: medial joint line pain Mcmurray's neg Flexion-pinch pos Varus and valgus stress: stable Lachman: neg Ant and Post drawer: neg Hip abduction, IR, ER: WNL Hip flexion str: 5/5 Hip abd: 5/5 Quad: 5/5 VMO atrophy:No Hamstring concentric and eccentric: 5/5   Radiology: Dg Knee Ap/lat W/sunrise Left  06/22/2016  CLINICAL DATA:  Medial and anterior knee pain without history of trauma EXAM: LEFT KNEE 3 VIEWS COMPARISON:  None in PACs FINDINGS: The bones are subjectively adequately mineralized. There is mild narrowing of the medial joint compartment. There is no acute or healing fracture. There may be a small suprapatellar effusion. No significant osteophyte formation is observed. IMPRESSION: Mild narrowing of the medial joint compartment compatible with osteoarthritis. No significant osteophyte formation is observed. A small suprapatellar effusion is suspected. Electronically Signed   By: Irven  Martinique M.D.   On: 06/22/2016 15:35    Assessment and Plan:   Left knee pain  His plain films are independently reviewed.  He has only mild medial compartmental osteoarthritis.  Hopefully he has only a arthritis exacerbation, and he is already doing better.  I'm going to change him to Naprosyn from meloxicam given his nausea.  For now, he is improving and I would only ice his knee for pain relief and after work and continue taking Naprosyn over the next couple of weeks.  This is not resolved in 3 or 4 weeks he can follow-up for additional intervention or testing.  Follow-up: No Follow-up on file.  New Prescriptions   NAPROXEN (NAPROSYN) 500 MG TABLET    Take 1 tablet (500 mg total) by mouth 2 (two) times daily with a meal.   Signed,  Cadyn Fann T.  Batina Dougan, MD   Patient's Medications  New Prescriptions   NAPROXEN (NAPROSYN) 500 MG TABLET    Take 1 tablet (500 mg total) by mouth 2 (two) times daily with a meal.  Previous Medications   ASPIRIN 81 MG TABLET    Take 81 mg by mouth daily.     ATORVASTATIN (LIPITOR) 10 MG TABLET    TAKE 1 TABLET NIGHTLY AT BEDTIME   EPINEPHRINE (EPIPEN) 0.3 MG/0.3 ML SOAJ INJECTION    Inject 0.3 mLs (0.3 mg total) into the muscle once.   ESOMEPRAZOLE (NEXIUM) 40 MG CAPSULE    Take 1 capsule (40 mg total) by mouth daily.   VALACYCLOVIR (VALTREX) 500 MG TABLET    Take 1 tablet (500 mg total) by  mouth daily.   VALSARTAN (DIOVAN) 160 MG TABLET    Take 1 tablet (160 mg total) by mouth daily.  Modified Medications   No medications on file  Discontinued Medications   MELOXICAM (MOBIC) 15 MG TABLET    Take 0.5-1 tablets (7.5-15 mg total) by mouth daily.

## 2016-06-28 NOTE — Progress Notes (Signed)
Pre visit review using our clinic review tool, if applicable. No additional management support is needed unless otherwise documented below in the visit note. 

## 2016-07-21 ENCOUNTER — Other Ambulatory Visit: Payer: Self-pay | Admitting: *Deleted

## 2016-07-21 MED ORDER — NAPROXEN 500 MG PO TABS: 500.0000 mg | ORAL_TABLET | Freq: Two times a day (BID) | ORAL | 0 refills | Status: DC

## 2016-08-14 ENCOUNTER — Encounter: Payer: Self-pay | Admitting: Family Medicine

## 2016-08-14 ENCOUNTER — Ambulatory Visit (INDEPENDENT_AMBULATORY_CARE_PROVIDER_SITE_OTHER): Payer: Commercial Managed Care - PPO | Admitting: Family Medicine

## 2016-08-14 ENCOUNTER — Telehealth: Payer: Self-pay

## 2016-08-14 DIAGNOSIS — B029 Zoster without complications: Secondary | ICD-10-CM | POA: Diagnosis not present

## 2016-08-14 MED ORDER — VALACYCLOVIR HCL 500 MG PO TABS: 500.0000 mg | ORAL_TABLET | Freq: Every day | ORAL | 3 refills | Status: DC

## 2016-08-14 MED ORDER — VALACYCLOVIR HCL 1 G PO TABS: 1000.0000 mg | ORAL_TABLET | Freq: Three times a day (TID) | ORAL | 0 refills | Status: DC

## 2016-08-14 MED ORDER — GABAPENTIN 300 MG PO CAPS: 300.0000 mg | ORAL_CAPSULE | Freq: Three times a day (TID) | ORAL | 1 refills | Status: DC | PRN

## 2016-08-14 NOTE — Assessment & Plan Note (Signed)
Start valtrex 1g tid, with gabapentin prn, sedation caution and routine instructions given.  When done, can change back to 500mg  valtrex for HSV suppression/cold sores.  He agrees.  Nontoxic.  Path/phys d/w pt.

## 2016-08-14 NOTE — Progress Notes (Signed)
H/o shingles in the past.  Now with R back dermatomal rash with pain and itching for 2 days.  Ran out of valtrex for cold sores.  No FCNAVD.    Meds, vitals, and allergies reviewed.   ROS: Per HPI unless specifically indicated in ROS section   nad R back dermatomal rash noted, with some small vesicles with dermatomal distribution of altered sensation.

## 2016-08-14 NOTE — Telephone Encounter (Signed)
PLEASE NOTE: All timestamps contained within this report are represented as [place] Standard Time. CONFIDENTIALTY NOTICE: This fax transmission is intended only for the addressee. It contains information that is legally privileged, confidential or otherwise protected from use or disclosure. If you are not the intended recipient, you are strictly prohibited from reviewing, disclosing, copying using or disseminating any of this information or taking any action in reliance on or regarding this information. If you have received this fax in error, please notify us immediately by telephone so that we can arrange for its return to us. Phone: [phone], Toll-Free: [phone], Fax: [phone] Page: 1 of 4 Call Id: [id number] [facility] - Client >>>Contains Verbal Order - Signature Required<<< TELEPHONE ADVICE RECORD [facility] Patient Name: Erik Mitchell Gender: Male DOB: 05/22/1961 Age: [age] Y 2 M 21 D Return Phone Number: [phone] (Primary) Address: City/State/Zip: [place] Client [place] [facility]  [facility] - Night Physician Raechel Acheuncan, Shaw - MD Contact Type Call Who Is Calling Patient / Member / Family / Caregiver Call Type Triage / Clinical Caller Name [name] Relationship To Patient Spouse Return Phone Number [phone] (Primary) Chief Complaint Rash - Localized Reason for Call Symptomatic / Request for Health Information Initial Comment caller states husband has shingles and needs a refill on Valtrex PreDisposition Call Doctor Translation No Nurse Assessment Nurse: Elroy ChannelIrvin, RN, Rosey Batheresa Date/Time Lamount Cohen(Eastern Time): 08/12/2016 2:55:16 PM Confirm and document reason for call. If symptomatic, describe symptoms. You must click the next button to save text entered. ---Caller states that he husband has shingles. Needs refill on Valtrex. Shingles are on his back. Has the patient  traveled out of the country within the last 30 days? ---No Does the patient have any new or worsening symptoms? ---Yes Will a triage be completed? ---Yes Related visit to physician within the last 2 weeks? ---No Does the PT have any chronic conditions? (i.e. diabetes, asthma, etc.) ---Yes List chronic conditions. ---high blood pressure Is this a behavioral health or substance abuse call? ---No Nurse: Elroy ChannelIrvin, RN, Rosey Batheresa Date/Time (Eastern Time): 08/12/2016 3:14:26 PM Please select the assessment type ---Verbal order / New medication order Additional Documentation ---Valtrex 500 mg take one daily for 7 days Does the client directives allow for assistance with medications after hours? ---Yes Other current medications? ---Yes List current medications. ---Diovan, Lipitor, Nexium PLEASE NOTE: All timestamps contained within this report are represented as [place] Standard Time. CONFIDENTIALTY NOTICE: This fax transmission is intended only for the addressee. It contains information that is legally privileged, confidential or otherwise protected from use or disclosure. If you are not the intended recipient, you are strictly prohibited from reviewing, disclosing, copying using or disseminating any of this information or taking any action in reliance on or regarding this information. If you have received this fax in error, please notify us immediately by telephone so that we can arrange for its return to us. Phone: [phone], Toll-Free: [phone], Fax: [phone] Page: 2 of 4 Call Id: [id number] Nurse Assessment Medication allergies? ---No Pharmacy name and phone number. ---[facility] [phone] Does the client directive allow for RN to call in the medication order to the pharmacy? ---Yes Guidelines Guideline Title Affirmed Question Affirmed Notes Nurse Date/Time Lamount Cohen(Eastern Time) Shingles [1] Shingles rash (matches SYMPTOMS) AND [2] onset within past 72 hours Shellee Milorvin, RN, Teresa 08/12/2016  2:59:03 PM Disp. Time Lamount Cohen(Eastern Time) Disposition Final User 08/12/2016 3:04:40 PM [facility] Irvin, [place],  Helene Kelp 08/12/2016 3:15:42 PM Call Completed Laurena Bering, RN, Helene Kelp 08/12/2016 3:02:10 PM See Physician within 24 Hours Yes Laurena Bering, RN, [place] Artist Understands: Yes Disagree/Comply: Comply Care Advice Given Per Guideline SEE PHYSICIAN WITHIN 24 HOURS: PAIN MEDICINES: * For pain relief, take acetaminophen, ibuprofen, or naproxen. * Use the lowest amount that makes your pain feel better. DON'T SCRATCH: * Itching is often worsened by scratching (the 'Itch-Scratch' cycle). * Try not to scratch. CONTAGIOUSNESS: * The shingles sores contain the chickenpox virus. So while others cannot get shingles from you, it is possible that they can get chickenpox (if they never had it before). * Avoid contact with pregnant women. Avoid contact with anyone who has a weakened immune system (immunocompromised) (e.g., HIV positive, cancer chemotherapy, chronic steroid treatment, splenectomy, organ transplant). CALL BACK IF: * Fever over 100.5 F (38.1 C) * You become worse. CARE ADVICE given per Shingles (Adult) guideline. Verbal Orders/Maintenance Medications Medication Refill Route Dosage Regime Duration Admin Instructions User Name Valtrex 500 mg Oral Take one daily 7 Days Take one daily Laurena Bering, RN, Helene Kelp Comments User: Dorothyann Peng, RN Date/Time Eilene Ghazi Time): 08/12/2016 3:11:33 PM Notified caller that medication would be called in. Verbalized understanding Referrals REFERRED TO PCP OFFICE REFERRED TO PCP OFFICE PLEASE NOTE: All timestamps contained within this report are represented as [place] Standard Time. CONFIDENTIALTY NOTICE: This fax transmission is intended only for the addressee. It contains information that is legally privileged, confidential or otherwise protected from use or disclosure. If you are not the intended recipient, you are strictly prohibited from reviewing, disclosing,  copying using or disseminating any of this information or taking any action in reliance on or regarding this information. If you have received this fax in error, please notify us immediately by telephone so that we can arrange for its return to [place]. Phone: [phone], Toll-Free: [phone], Fax: [phone] Page: 3 of 4 Call Id: [id number] Paging DoctorName Phone DateTime Result/Outcome Message Type Notes Garret Reddish - MD [id number] 08/12/2016 3:04:40 PM Paged On Call Back to Call Center Doctor Paged Garret Reddish - MD 08/12/2016 3:05:23 PM Spoke with On Call - General Message Result Gave report to MD. Order received to call in Valtrex PLEASE NOTE: All timestamps contained within this report are represented as [place] Standard Time. CONFIDENTIALTY NOTICE: This fax transmission is intended only for the addressee. It contains information that is legally privileged, confidential or otherwise protected from use or disclosure. If you are not the intended recipient, you are strictly prohibited from reviewing, disclosing, copying using or disseminating any of this information or taking any action in reliance on or regarding this information. If you have received this fax in error, please notify us immediately by telephone so that we can arrange for its return to [place]. Phone: [phone], Toll-Free: [phone], Fax: [phone] Page: 4 of 4 Call Id: [id number] [place] >>>Contains Verbal Order - Signature Required<<< [address] [place] [address] [phone] Fax: [phone] MEDICATION ORDER [facility] Night - Client [place] - Night Date: 08/12/2016 From: QI Department To: Renford Dills - MD Please sign the order for the approved drug(s) given by our call center nurse on your behalf. Fax to [phone] within 5 business days. Thank you. Date Eilene Ghazi Time):  08/12/2016 1:32:22 PM Triage RN: Dorothyann Peng, RN NAME: Jakyron Owens Shark PHONE NUMBER: [phone] (Primary) BIRTHDATE: 1961-05-22 ADDRESS: CITY/STATE/ZIP: [place] CALLER: Spouse NAME: [name] Rx Given Medication Refill Route Dosage Regime Duration Admin  Instructions User Name Valtrex 500 mg Oral Take one daily 7 Days Take one daily Laurena Bering, RN, Helene Kelp MD Signature Date

## 2016-08-14 NOTE — Progress Notes (Signed)
Pre visit review using our clinic review tool, if applicable. No additional management support is needed unless otherwise documented below in the visit note. 

## 2016-08-14 NOTE — Telephone Encounter (Signed)
Pt has appt with Dr Damita Dunnings today at 12 noon. The birthday does not match but the contact info is same.

## 2016-08-14 NOTE — Patient Instructions (Signed)
Start valtrex 1000mg  (1gram) 3 times a day.  Start gabapentin 300mg  once a day, gradually increase to 3 times a day if tolerated.  Sedation caution.  Update me as needed.  When done with the 1000mg  valtrex change back to 500mg  a day, 1 pill a day.  Take care.  Glad to see you.

## 2016-10-01 ENCOUNTER — Other Ambulatory Visit: Payer: Self-pay | Admitting: Family Medicine

## 2016-10-01 NOTE — Telephone Encounter (Signed)
Last office visit 08/14/16.  Naproxen not on current medication list.  Refill?

## 2016-10-01 NOTE — Telephone Encounter (Signed)
Please verify use of naproxen with patient. See how he is feeling. If he is continuing to have a lot of joint pain, he may need reevaluation. Please let me know if he is not doing well, if he is needing this medication often. Thanks.

## 2016-10-02 NOTE — Telephone Encounter (Signed)
Patient is not taking this medication, it gave him GI upset which is recorded in the chart.  Rx refused.

## 2016-12-01 ENCOUNTER — Other Ambulatory Visit: Payer: Self-pay | Admitting: Family Medicine

## 2016-12-01 ENCOUNTER — Other Ambulatory Visit (INDEPENDENT_AMBULATORY_CARE_PROVIDER_SITE_OTHER): Payer: Commercial Managed Care - PPO

## 2016-12-01 DIAGNOSIS — Z119 Encounter for screening for infectious and parasitic diseases, unspecified: Secondary | ICD-10-CM

## 2016-12-01 DIAGNOSIS — I10 Essential (primary) hypertension: Secondary | ICD-10-CM

## 2016-12-01 LAB — COMPREHENSIVE METABOLIC PANEL
ALT: 22 U/L (ref 0–53)
AST: 17 U/L (ref 0–37)
Albumin: 4.6 g/dL (ref 3.5–5.2)
Alkaline Phosphatase: 91 U/L (ref 39–117)
BUN: 15 mg/dL (ref 6–23)
CALCIUM: 9.7 mg/dL (ref 8.4–10.5)
CHLORIDE: 105 meq/L (ref 96–112)
CO2: 28 meq/L (ref 19–32)
Creatinine, Ser: 0.99 mg/dL (ref 0.40–1.50)
GFR: 83.51 mL/min (ref >60.00)
Glucose, Bld: 109 mg/dL — ABNORMAL HIGH (ref 70–99)
POTASSIUM: 4 meq/L (ref 3.5–5.1)
Sodium: 142 mEq/L (ref 135–145)
Total Bilirubin: 0.4 mg/dL (ref 0.2–1.2)
Total Protein: 7.3 g/dL (ref 6.0–8.3)

## 2016-12-01 LAB — HEPATITIS C ANTIBODY: HCV Ab: NEGATIVE

## 2016-12-01 LAB — LIPID PANEL
CHOL/HDL RATIO: 7
Cholesterol: 199 mg/dL (ref 0–200)
HDL: 27.7 mg/dL — AB (ref >39.00)
LDL CALC: 139 mg/dL — AB (ref 0–99)
NonHDL: 171.03
TRIGLYCERIDES: 159 mg/dL — AB (ref 0.0–149.0)
VLDL: 31.8 mg/dL (ref 0.0–40.0)

## 2016-12-04 ENCOUNTER — Ambulatory Visit (INDEPENDENT_AMBULATORY_CARE_PROVIDER_SITE_OTHER): Payer: Commercial Managed Care - PPO | Admitting: Family Medicine

## 2016-12-04 ENCOUNTER — Encounter: Payer: Self-pay | Admitting: Family Medicine

## 2016-12-04 VITALS — BP 126/84 | HR 81 | Temp 99.0°F | Ht 70.0 in | Wt 250.5 lb

## 2016-12-04 DIAGNOSIS — Z7189 Other specified counseling: Secondary | ICD-10-CM

## 2016-12-04 DIAGNOSIS — K219 Gastro-esophageal reflux disease without esophagitis: Secondary | ICD-10-CM

## 2016-12-04 DIAGNOSIS — E785 Hyperlipidemia, unspecified: Secondary | ICD-10-CM

## 2016-12-04 DIAGNOSIS — Z0000 Encounter for general adult medical examination without abnormal findings: Secondary | ICD-10-CM | POA: Diagnosis not present

## 2016-12-04 DIAGNOSIS — R739 Hyperglycemia, unspecified: Secondary | ICD-10-CM

## 2016-12-04 DIAGNOSIS — I10 Essential (primary) hypertension: Secondary | ICD-10-CM

## 2016-12-04 DIAGNOSIS — Z1211 Encounter for screening for malignant neoplasm of colon: Secondary | ICD-10-CM

## 2016-12-04 MED ORDER — ESOMEPRAZOLE MAGNESIUM 40 MG PO CPDR: 40.0000 mg | DELAYED_RELEASE_CAPSULE | Freq: Every day | ORAL | 3 refills | Status: DC

## 2016-12-04 MED ORDER — EPINEPHRINE 0.3 MG/0.3ML IJ SOAJ: 0.3000 mg | Freq: Once | INTRAMUSCULAR | 1 refills | Status: AC

## 2016-12-04 MED ORDER — VALSARTAN 160 MG PO TABS: 160.0000 mg | ORAL_TABLET | Freq: Every day | ORAL | 3 refills | Status: DC

## 2016-12-04 MED ORDER — ATORVASTATIN CALCIUM 10 MG PO TABS: ORAL_TABLET | ORAL | 3 refills | Status: DC

## 2016-12-04 NOTE — Patient Instructions (Addendum)
Call back for nurse visit for a flu shot if not done at the pharmacy.   Check with your insurance to see if they will cover the shingles shot. Erik Mitchell will call about your referral. Use the eat right diet in the meantime.  Take care.  Glad to see you.

## 2016-12-04 NOTE — Progress Notes (Signed)
Pre visit review using our clinic review tool, if applicable. No additional management support is needed unless otherwise documented below in the visit note. 

## 2016-12-04 NOTE — Progress Notes (Signed)
CPE- See plan.  Routine anticipatory guidance given to patient.  See health maintenance. Tetanus 2015 Flu d/w pt.  See AVS.  PNA 2009 Shingles not due  D/w patient [id number] for colon cancer screening, including IFOB vs. colonoscopy.  Risks and benefits of both were discussed and patient voiced understanding.  Pt elects [id number].  Prostate cancer screening and PSA options (with potential risks and benefits of testing vs not testing) were discussed along with recent recs/guidelines.  He declined testing PSA at this point. Living will d/w pt.  Wife designated if patient were incapacitated.   Diet and exercise d/w pt. Encouraged both.  Walking some at work.  D/w him about diet.   HCV neg.  D/w pt.   HIV prev neg in ~1996 per patient report.   Hypertension:   Using medication without problems or lightheadedness: yes Chest pain with exertion:no Edema:no Short of breath:no  Elevated Cholesterol: Using medications without problems:yes Muscle aches: not from statin Diet compliance: encouraged Exercise: encouraged  Prev shingles resolved.   GERD.  Controlled on med.  No ADE on med.  Compliant.    PMH and SH reviewed  Meds, vitals, and allergies reviewed.   ROS: Per HPI.  Unless specifically indicated otherwise in HPI, the patient denies:  General: fever. Eyes: acute vision changes ENT: sore throat Cardiovascular: chest pain Respiratory: SOB GI: vomiting GU: dysuria Musculoskeletal: acute back pain Derm: acute rash Neuro: acute motor dysfunction Psych: worsening mood Endocrine: polydipsia Heme: bleeding Allergy: hayfever  GEN: nad, alert and oriented HEENT: mucous membranes moist NECK: supple w/o LA CV: rrr. PULM: ctab, no inc wob ABD: soft, +bs EXT: no edema SKIN: no acute rash

## 2016-12-05 DIAGNOSIS — Z7189 Other specified counseling: Secondary | ICD-10-CM | POA: Insufficient documentation

## 2016-12-05 NOTE — Assessment & Plan Note (Signed)
Living will d/w pt.  Wife designated if patient were incapacitated.   ?

## 2016-12-05 NOTE — Assessment & Plan Note (Signed)
Discussed with patient about diet and exercise. Low-carb handout given to patient and discussed. He understood. We can recheck periodically. He agrees.

## 2016-12-05 NOTE — Assessment & Plan Note (Signed)
Controlled. Continue current med. No adverse effect on medication. He agrees.

## 2016-12-05 NOTE — Assessment & Plan Note (Signed)
Continue current med. No adverse effect on medication. He agrees.

## 2016-12-05 NOTE — Assessment & Plan Note (Signed)
Tetanus 2015 Flu d/w pt.  See AVS.  PNA 2009 Shingles not due  D/w patient [id number] for colon cancer screening, including IFOB vs. colonoscopy.  Risks and benefits of both were discussed and patient voiced understanding.  Pt elects [id number].  Prostate cancer screening and PSA options (with potential risks and benefits of testing vs not testing) were discussed along with recent recs/guidelines.  He declined testing PSA at this point. Living will d/w pt.  Wife designated if patient were incapacitated.   Diet and exercise d/w pt. Encouraged both.  Walking some at work.  D/w him about diet.   HCV neg.  D/w pt.   HIV prev neg in ~1996 per patient report.

## 2017-01-02 ENCOUNTER — Telehealth: Payer: Self-pay | Admitting: Family Medicine

## 2017-01-02 NOTE — Telephone Encounter (Signed)
[facility]  Patient Name: Erik Mitchell  DOB: [birth year]    Initial Comment RECORD 2 OF 2 Caller says a skunk got in and bit their dog and was told that she and husband were exposed to rabies because they handled the dog or the skunk. The skunk is being tested. Wants to know what the protocol is for them    Nurse Assessment  Nurse: Wynetta Emery, RN, Baker Janus Date/Time Eilene Ghazi Time): 01/02/2017 4:53:09 PM  Confirm and document reason for call. If symptomatic, describe symptoms. ---[name] and [name]'s dog was bitten on nose and paw by a skunk on Saturday-- Cherrie cleaned dog's wounds up immediately and was exposed to skunk's -- holiday weekend animal control not available they did come pick skunk up today. [name] had contact with the skunk itself.  Does the patient have any new or worsening symptoms? ---Yes  Will a triage be completed? ---Yes  Related visit to physician within the last 2 weeks? ---No  Does the PT have any chronic conditions? (i.e. diabetes, asthma, etc.) ---No  Is this a behavioral health or substance abuse call? ---No     Guidelines    Guideline Title Affirmed Question Affirmed Notes  Animal Bite [1] Non-bite body fluid contact (e.g., saliva, brain) AND [2] onto open cut/wound or mucous membranes AND [3] animal at high-risk for RABIES (e.g., bat, raccoon, fox, skunk, coyote, other carnivores)    Final Disposition User   See Physician within 4 Hours (or PCP triage) Wynetta Emery, RN, Baker Janus    Referrals  GO TO [place] UNDECIDED   Disagree/Comply: Comply

## 2017-01-05 ENCOUNTER — Encounter (HOSPITAL_COMMUNITY): Payer: Self-pay | Admitting: Emergency Medicine

## 2017-01-05 ENCOUNTER — Emergency Department (HOSPITAL_COMMUNITY)
Admission: EM | Admit: 2017-01-05 | Discharge: 2017-01-05 | Disposition: A | Discharge status: Home / Self Care | Payer: Commercial Managed Care - PPO | Attending: Emergency Medicine | Admitting: Emergency Medicine

## 2017-01-05 DIAGNOSIS — Z7982 Long term (current) use of aspirin: Secondary | ICD-10-CM | POA: Insufficient documentation

## 2017-01-05 DIAGNOSIS — Z23 Encounter for immunization: Secondary | ICD-10-CM | POA: Diagnosis not present

## 2017-01-05 DIAGNOSIS — Z203 Contact with and (suspected) exposure to rabies: Secondary | ICD-10-CM | POA: Diagnosis present

## 2017-01-05 DIAGNOSIS — I10 Essential (primary) hypertension: Secondary | ICD-10-CM | POA: Diagnosis not present

## 2017-01-05 MED ORDER — RABIES IMMUNE GLOBULIN 150 UNIT/ML IM INJ
20.0000 [IU]/kg | INJECTION | Freq: Once | INTRAMUSCULAR | Status: AC
  Administered 2017-01-05: 2250 [IU] via INTRAMUSCULAR
  Filled 2017-01-05: qty 15

## 2017-01-05 MED ORDER — RABIES VACCINE, PCEC IM SUSR
1.0000 mL | Freq: Once | INTRAMUSCULAR | Status: AC
  Administered 2017-01-05: 1 mL via INTRAMUSCULAR
  Filled 2017-01-05: qty 1

## 2017-01-05 NOTE — ED Triage Notes (Signed)
Patient reports potential rabies exposure last Saturday when a skunk bit the patients dog. Patient reports contact with the saliva of the dog and skunk. States she was wearing gloves but was referred here for rabies shots from her PCP. Denies any complaints.

## 2017-01-05 NOTE — Discharge Instructions (Signed)
DAY 0:  01/05/2017      DAY 3:  01/08/2017       DAY 7:  01/12/2017     DAY 14:  01/19/2017

## 2017-01-05 NOTE — ED Provider Notes (Signed)
[facility] DEPT Provider Note   CSN: [id number] Arrival date & time: 01/05/17  1841     History   Chief Complaint No chief complaint on file.    HPI  Blood pressure 157/86, pulse 86, temperature 99.5 F (37.5 C), temperature source Oral, resp. rate 18, height 5' 10.5" (1.791 m), weight 112 kg, SpO2 98 %.  Erik Mitchell is a 56 y.o. male resenting for rabies vaccination. He states that 3 days ago when he came home there was a skunk in the dog pen with his dog. He states that the skunk a bit his dog and that he used cloth gloves that were covered in plastic to remove the skunk. There was no trauma that he knew of, the was not bitten or scratched in any way. He somehow the skunk and it was frozen he submitted this to the state that for evaluation. The test are still pending. He was advised to present for rabies vaccine today by his primary care: There was some confusion as to where he should get the vaccination if [facility] over the last few days because of inclement weather. Patient with no complaints at this time. Dog is behaving normally and fully vaccinated.    Past Medical History:  Diagnosis Date  . Allergic rhinitis 02/16/2003  . GERD (gastroesophageal reflux disease) 12/18/2002  . H/O cold sores    valtrex suppression  . History of ETT 11/96   wnl, cardiology consult0 [facility] 01/97  . Hyperlipemia 08/19/1995  . Hypertension 08/19/95  . Shingles     Patient Active Problem List   Diagnosis Date Noted  . Advance care planning 12/05/2016  . Zoster 08/14/2016  . Left knee pain 06/23/2016  . Inguinal pain 10/16/2015  . Obesity (BMI 30-39.9) 03/01/2014  . Panic 08/08/2013  . Routine general medical examination at a health care facility 10/01/2012  . Herpes simplex virus (HSV) infection 09/05/2007  . Hyperglycemia 09/05/2007  . ALLERGIC RHINITIS 02/16/2003  . GERD 12/18/2002  . HLD (hyperlipidemia) 08/19/1995  .  Essential hypertension 08/19/1995    Past Surgical History:  Procedure Laterality Date  . TENDON REPAIR     R 4th finger  . TONSILLECTOMY         Home Medications    Prior to Admission medications   Medication Sig Start Date End Date Taking? Authorizing Provider  aspirin 81 MG tablet Take 81 mg by mouth daily.      Historical Provider, MD  atorvastatin (LIPITOR) 10 MG tablet TAKE 1 TABLET NIGHTLY AT BEDTIME 12/04/16   Tonia Ghent, MD  esomeprazole (NEXIUM) 40 MG capsule Take 1 capsule (40 mg total) by mouth daily. 12/04/16   Tonia Ghent, MD  valsartan (DIOVAN) 160 MG tablet Take 1 tablet (160 mg total) by mouth daily. 12/04/16   Tonia Ghent, MD    Family History Family History  Problem Relation Age of Onset  . Diabetes Mother   . Heart disease Mother   . Cancer Mother   . Colon cancer Mother     presumed  . Heart disease Father     MI CABG x 3 Pacer  . Diabetes Brother   . Heart disease Brother     MI x 2 Stents  . Prostate cancer Neg Hx     Social History Social History  Substance Use Topics  . Smoking status: Never Smoker  . Smokeless tobacco: Never Used  . Alcohol use  0.0 oz/week     Comment: very little     Allergies   Bee venom; Meloxicam; and Naproxen   Review of Systems Review of Systems   10 systems reviewed and found to be negative, except as noted in the HPI.   Physical Exam Updated Vital Signs BP 157/86 (BP Location: Right Arm)   Pulse 86   Temp 99.5 F (37.5 C) (Oral)   Resp 18   Ht 5' 10.5" (1.791 m)   Wt 112 kg   SpO2 98%   BMI 34.94 kg/m   Physical Exam  Constitutional: He is oriented to person, place, and time. He appears well-developed and well-nourished. No distress.  HENT:  Head: Normocephalic and atraumatic.  Mouth/Throat: Oropharynx is clear and moist.  Eyes: Conjunctivae and EOM are normal. Pupils are equal, round, and reactive to light.  Neck: Normal range of motion.  Cardiovascular: Normal rate, regular  rhythm and intact distal pulses.   Pulmonary/Chest: Effort normal and breath sounds normal.  Abdominal: Soft. There is no tenderness.  Musculoskeletal: Normal range of motion.  Neurological: He is alert and oriented to person, place, and time.  Skin: He is not diaphoretic.  Psychiatric: He has a normal mood and affect.  Nursing note and vitals reviewed.    ED Treatments / Results  Labs (all labs ordered are listed, but only abnormal results are displayed) Labs Reviewed - No data to display  EKG  EKG Interpretation None       Radiology No results found.  Procedures Procedures (including critical care time)  Medications Ordered in ED Medications - No data to display   Initial Impression / Assessment and Plan / ED Course  I have reviewed the triage vital signs and the nursing notes.  Pertinent labs & imaging results that were available during my care of the patient were reviewed by me and considered in my medical decision making (see chart for details).     Vitals:   01/05/17 1914 01/05/17 1946  BP: 157/86   Pulse: 86   Resp: 18   Temp: 99.5 F (37.5 C)   TempSrc: Oral   SpO2: 98%   Weight: 111.6 kg 112 kg  Height: 5' 10.5" (1.791 m)     Medications  rabies vaccine (RABAVERT) injection 1 mL (1 mL Intramuscular Given 01/05/17 2054)  rabies immune globulin (HYPERAB) injection 2,250 Units (2,250 Units Intramuscular Given 01/05/17 2103)    Erik Mitchell is 56 y.o. male presenting with request for rabies vaccination. He was exposed several days ago. There was no direct break in the skin however he did come into contact with the skunk who bit his dog. His dog is behaving normally, he is fully vaccinated. Patient is given rabies vaccine and IgG, I've counseled them on where to present for rabies booster shots and the timing of the boosters.  Evaluation does not show pathology that would require ongoing emergent intervention or inpatient treatment. Pt is hemodynamically  stable and mentating appropriately. Discussed findings and plan with patient/guardian, who agrees with care plan. All questions answered. Return precautions discussed and outpatient follow up given.      Final Clinical Impressions(s) / ED Diagnoses   Final diagnoses:  None    New Prescriptions New Prescriptions   No medications on file     Monico Blitz, PA-C 01/05/17 [place], MD 01/06/17 2251

## 2017-01-05 NOTE — Telephone Encounter (Signed)
If the skunk was picked up for testing, then the patient doesn't have to go for rabies vaccine at this point.  If the skunk turns out to be positive, patient will need rabies vaccine at ER.  If the skunk isn't available for testing, then patient will still need rabies vaccine at ER.  Thanks.

## 2017-01-05 NOTE — Telephone Encounter (Signed)
Noted. Thanks.

## 2017-01-05 NOTE — Telephone Encounter (Signed)
Spoke to patient's wife and was advised that's she was the one that called. Patient's wife stated that the skunk was picked up and is being tested. They are suppose to hear back from animal control today.

## 2017-01-05 NOTE — Telephone Encounter (Signed)
I spoke with pts wife and they got snowed in; should hear from animal control today about rabies testing. Pt is doing fine and their dog is OK also. FYI to Dr Damita Dunnings.

## 2017-01-08 ENCOUNTER — Encounter (HOSPITAL_COMMUNITY): Payer: Self-pay | Admitting: Emergency Medicine

## 2017-01-08 ENCOUNTER — Ambulatory Visit (HOSPITAL_COMMUNITY)
Admission: EM | Admit: 2017-01-08 | Discharge: 2017-01-08 | Disposition: A | Discharge status: Home / Self Care | Payer: Commercial Managed Care - PPO | Attending: Family Medicine | Admitting: Family Medicine

## 2017-01-08 DIAGNOSIS — Z203 Contact with and (suspected) exposure to rabies: Secondary | ICD-10-CM | POA: Diagnosis not present

## 2017-01-08 MED ORDER — RABIES VACCINE, PCEC IM SUSR
1.0000 mL | Freq: Once | INTRAMUSCULAR | Status: AC
  Administered 2017-01-08: 1 mL via INTRAMUSCULAR

## 2017-01-08 MED ORDER — RABIES VACCINE, PCEC IM SUSR
INTRAMUSCULAR | Status: AC
  Filled 2017-01-08: qty 1

## 2017-01-08 NOTE — ED Triage Notes (Signed)
The patient presented to the [facility] to receive the Day 3 rabies booster injection. 

## 2017-01-08 NOTE — Discharge Instructions (Signed)
Please return to the [facility] on 01/12/2017 for your Day 7 rabies booster injection.

## 2017-01-12 ENCOUNTER — Encounter (HOSPITAL_COMMUNITY): Payer: Self-pay | Admitting: *Deleted

## 2017-01-12 ENCOUNTER — Ambulatory Visit (HOSPITAL_COMMUNITY)
Admission: EM | Admit: 2017-01-12 | Discharge: 2017-01-12 | Disposition: A | Discharge status: Home / Self Care | Payer: Commercial Managed Care - PPO | Attending: Family Medicine | Admitting: Family Medicine

## 2017-01-12 DIAGNOSIS — Z203 Contact with and (suspected) exposure to rabies: Secondary | ICD-10-CM | POA: Diagnosis not present

## 2017-01-12 MED ORDER — RABIES VACCINE, PCEC IM SUSR
INTRAMUSCULAR | Status: AC
  Filled 2017-01-12: qty 1

## 2017-01-12 MED ORDER — RABIES VACCINE, PCEC IM SUSR
1.0000 mL | Freq: Once | INTRAMUSCULAR | Status: AC
  Administered 2017-01-12: 1 mL via INTRAMUSCULAR

## 2017-01-12 NOTE — ED Triage Notes (Signed)
Pt  Is  Here  For  For  Day  7  Of rabies    Series          Verbalizes       No   complaints

## 2017-01-12 NOTE — Discharge Instructions (Signed)
Return  In  1  Week  For  Your  Next     Shot  In   Series      Sooner  If  Any   Problems

## 2017-01-19 ENCOUNTER — Ambulatory Visit (HOSPITAL_COMMUNITY)
Admission: EM | Admit: 2017-01-19 | Discharge: 2017-01-19 | Disposition: A | Discharge status: Home / Self Care | Payer: Commercial Managed Care - PPO | Attending: Family Medicine | Admitting: Family Medicine

## 2017-01-19 ENCOUNTER — Encounter (HOSPITAL_COMMUNITY): Payer: Self-pay | Admitting: Emergency Medicine

## 2017-01-19 DIAGNOSIS — Z203 Contact with and (suspected) exposure to rabies: Secondary | ICD-10-CM | POA: Diagnosis not present

## 2017-01-19 MED ORDER — RABIES VACCINE, PCEC IM SUSR
1.0000 mL | Freq: Once | INTRAMUSCULAR | Status: AC
  Administered 2017-01-19: 1 mL via INTRAMUSCULAR

## 2017-01-19 MED ORDER — RABIES VACCINE, PCEC IM SUSR
INTRAMUSCULAR | Status: AC
  Filled 2017-01-19: qty 1

## 2017-01-19 NOTE — ED Triage Notes (Signed)
Here for rabies vaccination day 14 (#4)  Voices no new concerns.  A&O x4... NAD

## 2017-02-12 ENCOUNTER — Encounter: Payer: Self-pay | Admitting: Family Medicine

## 2017-03-05 ENCOUNTER — Other Ambulatory Visit: Payer: Self-pay | Admitting: Family Medicine

## 2017-03-05 NOTE — Telephone Encounter (Signed)
Received refill electronically Last office visit 12/04/16 Medication is no longer on medication list

## 2017-03-06 NOTE — Telephone Encounter (Signed)
Left message on voicemail for patient to call back. 

## 2017-03-06 NOTE — Telephone Encounter (Signed)
Spoke to patient and was advised that he does not need the prescription because he already has a supply from [employer]. Called [facility] and spoke to [place] and cancelled the script as instructed.

## 2017-03-06 NOTE — Telephone Encounter (Signed)
Prev on suppression, okay to continue.  Verify with patient this was requested refill- cancel if not requested.  I sent rx in the meantime.  Thanks.

## 2018-10-03 ENCOUNTER — Other Ambulatory Visit: Payer: Self-pay | Admitting: Family Medicine

## 2018-10-03 DIAGNOSIS — E785 Hyperlipidemia, unspecified: Secondary | ICD-10-CM

## 2018-10-04 ENCOUNTER — Other Ambulatory Visit (INDEPENDENT_AMBULATORY_CARE_PROVIDER_SITE_OTHER): Payer: BC Managed Care – PPO

## 2018-10-04 DIAGNOSIS — E785 Hyperlipidemia, unspecified: Secondary | ICD-10-CM | POA: Diagnosis not present

## 2018-10-04 NOTE — Addendum Note (Signed)
Addended by: Lendon Collar on: 10/04/2018 03:49 PM   Modules accepted: Orders

## 2018-10-05 LAB — LIPID PANEL
CHOLESTEROL: 210 mg/dL — AB (ref <200)
HDL: 32 mg/dL — AB (ref >40)
LDL Cholesterol (Calc): 157 mg/dL (calc) — ABNORMAL HIGH
NON-HDL CHOLESTEROL (CALC): 178 mg/dL — AB (ref <130)
Total CHOL/HDL Ratio: 6.6 (calc) — ABNORMAL HIGH (ref <5.0)
Triglycerides: 98 mg/dL (ref <150)

## 2018-10-05 LAB — COMPREHENSIVE METABOLIC PANEL
AG Ratio: 2 (calc) (ref 1.0–2.5)
ALKALINE PHOSPHATASE (APISO): 63 U/L (ref 40–115)
ALT: 10 U/L (ref 9–46)
AST: 15 U/L (ref 10–35)
Albumin: 4.5 g/dL (ref 3.6–5.1)
BUN: 20 mg/dL (ref 7–25)
CALCIUM: 9.7 mg/dL (ref 8.6–10.3)
CHLORIDE: 106 mmol/L (ref 98–110)
CO2: 24 mmol/L (ref 20–32)
Creat: 0.93 mg/dL (ref 0.70–1.33)
GLUCOSE: 79 mg/dL (ref 65–99)
Globulin: 2.2 g/dL (calc) (ref 1.9–3.7)
Potassium: 3.9 mmol/L (ref 3.5–5.3)
Sodium: 141 mmol/L (ref 135–146)
Total Bilirubin: 0.7 mg/dL (ref 0.2–1.2)
Total Protein: 6.7 g/dL (ref 6.1–8.1)

## 2018-10-10 ENCOUNTER — Ambulatory Visit (INDEPENDENT_AMBULATORY_CARE_PROVIDER_SITE_OTHER): Payer: BC Managed Care – PPO | Admitting: Family Medicine

## 2018-10-10 ENCOUNTER — Encounter: Payer: Self-pay | Admitting: Family Medicine

## 2018-10-10 VITALS — BP 116/80 | HR 50 | Temp 98.8°F | Ht 70.5 in | Wt 214.5 lb

## 2018-10-10 DIAGNOSIS — Z659 Problem related to unspecified psychosocial circumstances: Secondary | ICD-10-CM

## 2018-10-10 DIAGNOSIS — I10 Essential (primary) hypertension: Secondary | ICD-10-CM

## 2018-10-10 DIAGNOSIS — Z0000 Encounter for general adult medical examination without abnormal findings: Secondary | ICD-10-CM | POA: Diagnosis not present

## 2018-10-10 DIAGNOSIS — B009 Herpesviral infection, unspecified: Secondary | ICD-10-CM

## 2018-10-10 DIAGNOSIS — Z23 Encounter for immunization: Secondary | ICD-10-CM | POA: Diagnosis not present

## 2018-10-10 DIAGNOSIS — K219 Gastro-esophageal reflux disease without esophagitis: Secondary | ICD-10-CM

## 2018-10-10 DIAGNOSIS — Z7189 Other specified counseling: Secondary | ICD-10-CM

## 2018-10-10 DIAGNOSIS — E785 Hyperlipidemia, unspecified: Secondary | ICD-10-CM

## 2018-10-10 DIAGNOSIS — Z1211 Encounter for screening for malignant neoplasm of colon: Secondary | ICD-10-CM

## 2018-10-10 MED ORDER — VALACYCLOVIR HCL 500 MG PO TABS: 500.0000 mg | ORAL_TABLET | Freq: Every day | ORAL | 3 refills | Status: DC

## 2018-10-10 MED ORDER — ATORVASTATIN CALCIUM 10 MG PO TABS: ORAL_TABLET | ORAL | 3 refills | Status: DC

## 2018-10-10 MED ORDER — ESOMEPRAZOLE MAGNESIUM 40 MG PO CPDR: 40.0000 mg | DELAYED_RELEASE_CAPSULE | Freq: Every day | ORAL | 3 refills | Status: DC

## 2018-10-10 NOTE — Patient Instructions (Signed)
Check on a counseling appointment.  Update me as needed.   Recheck fasting last in about 2 months.   Restart meds except for the valsartan.  Your BP is fine.  Take care.  Glad to see you.  We will call about your referral.  Rosaria Ferries or Azalee Course will call you if you don't see one of them on the way out.

## 2018-10-10 NOTE — Progress Notes (Signed)
CPE- See plan.  Routine anticipatory guidance given to patient.  See health maintenance.  The possibility exists that previously documented standard health maintenance information may have been brought forward from a previous encounter into this note.  If needed, that same information has been updated to reflect the current situation based on today's encounter.    Tetanus 2015 Flu today.   PNA 2009 Shingles not due  D/w patient [id number] for colon cancer screening, including IFOB vs. colonoscopy.  Risks and benefits of both were discussed and patient voiced understanding.  Pt elects [id number].  Prostate cancer screening and PSA options(with potential risks and benefits of testing vs not testing) were discussed along with recent recs/guidelines. He declined testing PSAat this point. Living will d/w pt. Wife designated if patient were incapacitated.  Diet and exercise d/w pt. Encouraged both.  he has been walking for exercise.  Intentional weight loss.   HCV neg.  D/w pt.   HIV prev neg in ~1996 per patient report.   Elevated Cholesterol: Using medications without problems: had been off statin.   Muscle aches: no Diet compliance: yes Exercise:yes D/w pt about restart statin given 8% ASCVD score.    Hx of HTN.  Off med and BP is normal.  Weight loss noted.    GERD.  Still on PPI.  With some relief.    Was prev on valtrex daily, had flares when he stopped med, with cold sore eruption.    He had mult job changes and stressors at home.  No Si/Hi.  Mood is lower, he had several panic attacks.  He wanted to go to counseling.  Handout and information given to patient.  He'll update me as needed.    PMH and SH reviewed  Meds, vitals, and allergies reviewed.   ROS: Per HPI.  Unless specifically indicated otherwise in HPI, the patient denies:  General: fever. Eyes: acute vision changes ENT: sore throat Cardiovascular: chest pain Respiratory: SOB GI: vomiting GU:  dysuria Musculoskeletal: acute back pain Derm: acute rash Neuro: acute motor dysfunction Psych: worsening mood Endocrine: polydipsia Heme: bleeding Allergy: hayfever  GEN: nad, alert and oriented HEENT: mucous membranes moist NECK: supple w/o LA CV: rrr. PULM: ctab, no inc wob ABD: soft, +bs EXT: no edema SKIN: no acute rash

## 2018-10-11 ENCOUNTER — Encounter: Payer: Self-pay | Admitting: Internal Medicine

## 2018-10-13 DIAGNOSIS — Z659 Problem related to unspecified psychosocial circumstances: Secondary | ICD-10-CM | POA: Insufficient documentation

## 2018-10-13 NOTE — Assessment & Plan Note (Signed)
Tetanus 2015 Flu today.   PNA 2009 Shingles not due  D/w patient [id number] for colon cancer screening, including IFOB vs. colonoscopy.  Risks and benefits of both were discussed and patient voiced understanding.  Pt elects [id number].  Prostate cancer screening and PSA options(with potential risks and benefits of testing vs not testing) were discussed along with recent recs/guidelines. He declined testing PSAat this point. Living will d/w pt. Wife designated if patient were incapacitated.  Diet and exercise d/w pt. Encouraged both.  he has been walking for exercise.  Intentional weight loss.   HCV neg.  D/w pt.   HIV prev neg in ~1996 per patient report.

## 2018-10-13 NOTE — Assessment & Plan Note (Signed)
Discussed counseling.  Referral information given to patient.  No suicidal or homicidal intent.  He will update me as needed.  I appreciate the help of all involved.

## 2018-10-13 NOTE — Assessment & Plan Note (Signed)
Apparently resolved with weight loss.

## 2018-10-13 NOTE — Assessment & Plan Note (Signed)
Continue Valtrex.  Discussed with patient.

## 2018-10-13 NOTE — Assessment & Plan Note (Signed)
Likely reasonable to restart statin given 8% ASCVD score.  Discussed with patient.  He agrees.  Continue work on diet and exercise.  Recheck labs in a few months.  See after visit summary.  He agrees.

## 2018-10-13 NOTE — Assessment & Plan Note (Signed)
Living will d/w pt.  Wife designated if patient were incapacitated.   ?

## 2018-10-13 NOTE — Assessment & Plan Note (Signed)
Some relief from medication.  Failed trial of medication previously.  Continue as is.

## 2018-11-12 ENCOUNTER — Ambulatory Visit (AMBULATORY_SURGERY_CENTER): Payer: Self-pay

## 2018-11-12 VITALS — Ht 68.0 in | Wt 215.4 lb

## 2018-11-12 DIAGNOSIS — Z1211 Encounter for screening for malignant neoplasm of colon: Secondary | ICD-10-CM

## 2018-11-12 MED ORDER — NA SULFATE-K SULFATE-MG SULF 17.5-3.13-1.6 GM/177ML PO SOLN: 1.0000 | Freq: Once | ORAL | 0 refills | Status: AC

## 2018-11-12 NOTE — Progress Notes (Signed)
Denies allergies to eggs or soy products. Denies complication of anesthesia or sedation. Denies use of weight loss medication. Denies use of O2.   Emmi instructions declined.  

## 2018-11-13 ENCOUNTER — Encounter: Payer: Self-pay | Admitting: Internal Medicine

## 2018-11-27 ENCOUNTER — Ambulatory Visit (AMBULATORY_SURGERY_CENTER): Payer: BC Managed Care – PPO | Admitting: Internal Medicine

## 2018-11-27 ENCOUNTER — Encounter: Payer: Self-pay | Admitting: Internal Medicine

## 2018-11-27 VITALS — BP 130/77 | HR 52 | Temp 97.1°F | Resp 14 | Ht 68.0 in | Wt 215.0 lb

## 2018-11-27 DIAGNOSIS — D125 Benign neoplasm of sigmoid colon: Secondary | ICD-10-CM | POA: Diagnosis not present

## 2018-11-27 DIAGNOSIS — D128 Benign neoplasm of rectum: Secondary | ICD-10-CM

## 2018-11-27 DIAGNOSIS — D120 Benign neoplasm of cecum: Secondary | ICD-10-CM

## 2018-11-27 DIAGNOSIS — D124 Benign neoplasm of descending colon: Secondary | ICD-10-CM

## 2018-11-27 DIAGNOSIS — Z1211 Encounter for screening for malignant neoplasm of colon: Secondary | ICD-10-CM | POA: Diagnosis present

## 2018-11-27 DIAGNOSIS — D123 Benign neoplasm of transverse colon: Secondary | ICD-10-CM

## 2018-11-27 DIAGNOSIS — D122 Benign neoplasm of ascending colon: Secondary | ICD-10-CM | POA: Diagnosis not present

## 2018-11-27 MED ORDER — SODIUM CHLORIDE 0.9 % IV SOLN: 500.0000 mL | Freq: Once | INTRAVENOUS | Status: DC

## 2018-11-27 NOTE — Patient Instructions (Signed)
YOU HAD AN ENDOSCOPIC PROCEDURE TODAY AT [facility]:   Refer to the procedure report that was given to you for any specific questions about what was found during the examination.  If the procedure report does not answer your questions, please call your gastroenterologist to clarify.  If you requested that your care partner not be given the details of your procedure findings, then the procedure report has been included in a sealed envelope for you to review at your convenience later.  YOU SHOULD EXPECT: Some feelings of bloating in the abdomen. Passage of more gas than usual.  Walking can help get rid of the air that was put into your GI tract during the procedure and reduce the bloating. If you had a lower endoscopy (such as a colonoscopy or flexible sigmoidoscopy) you may notice spotting of blood in your stool or on the toilet paper. If you underwent a bowel prep for your procedure, you may not have a normal bowel movement for a few days.  Please Note:  You might notice some irritation and congestion in your nose or some drainage.  This is from the oxygen used during your procedure.  There is no need for concern and it should clear up in a day or so.  SYMPTOMS TO REPORT IMMEDIATELY:   Following lower endoscopy (colonoscopy or flexible sigmoidoscopy):  Excessive amounts of blood in the stool  Significant tenderness or worsening of abdominal pains  Swelling of the abdomen that is new, acute  Fever of 100F or higher   For urgent or emergent issues, a gastroenterologist can be reached at any hour by calling [phone].   DIET:  We do recommend a small meal at first, but then you may proceed to your regular diet.  Drink plenty of fluids but you should avoid alcoholic beverages for 24 hours.  MEDICATIONS: Continue present medications. No Ibuprofen, Naproxen, or other non-steroidal anti-inflammatory drugs for 2 weeks after polyp removal. Low dose Aspirin is okay (81 mg daily),  but avoid additional Aspirin for 2 weeks.  Please see handouts given to you by your recovery nurse.  ACTIVITY:  You should plan to take it easy for the rest of today and you should NOT DRIVE or use heavy machinery until tomorrow (because of the sedation medicines used during the test).    FOLLOW UP: Our staff will call the number listed on your records the next business day following your procedure to check on you and address any questions or concerns that you may have regarding the information given to you following your procedure. If we do not reach you, we will leave a message.  However, if you are feeling well and you are not experiencing any problems, there is no need to return our call.  We will assume that you have returned to your regular daily activities without incident.  If any biopsies were taken you will be contacted by phone or by letter within the next 1-3 weeks.  Please call us at [phone] if you have not heard about the biopsies in 3 weeks.   Thank you for allowing [place] to provide for your healthcare needs today.   SIGNATURES/CONFIDENTIALITY: You and/or your care partner have signed paperwork which will be entered into your electronic medical record.  These signatures attest to the fact that that the information above on your After Visit Summary has been reviewed and is understood.  Full responsibility of the confidentiality of this discharge information lies with you and/or  your care-partner. 

## 2018-11-27 NOTE — Progress Notes (Signed)
Report to PACU, RN, vss, BBS= Clear.  

## 2018-11-27 NOTE — Progress Notes (Signed)
Called to room to assist during endoscopic procedure.  Patient ID and intended procedure confirmed with present staff. Received instructions for my participation in the procedure from the performing physician.  

## 2018-11-27 NOTE — Op Note (Signed)
[place] Patient Name: Erik Mitchell Procedure Date: 11/27/2018 10:45 AM MRN: [id number] Endoscopist: Jerene Bears , MD Age: 57 Referring MD:  Date of Birth: 31-May-1961 Gender: Male Account #: [id number] Procedure:                Colonoscopy Indications:              Screening for colorectal malignant neoplasm, This                            is the patient's first colonoscopy Medicines:                Monitored Anesthesia Care Procedure:                Pre-Anesthesia Assessment:                           - Prior to the procedure, a History and Physical                            was performed, and patient medications and                            allergies were reviewed. The patient's tolerance of                            previous anesthesia was also reviewed. The risks                            and benefits of the procedure and the sedation                            options and risks were discussed with the patient.                            All questions were answered, and informed consent                            was obtained. Prior Anticoagulants: The patient has                            taken no previous anticoagulant or antiplatelet                            agents. ASA Grade Assessment: II - A patient with                            mild systemic disease. After reviewing the risks                            and benefits, the patient was deemed in                            satisfactory condition to undergo the procedure.  After obtaining informed consent, the colonoscope                            was passed under direct vision. Throughout the                            procedure, the patient's blood pressure, pulse, and                            oxygen saturations were monitored continuously. The                            Model CF-HQ190L [id number]) scope was introduced                            through the anus and advanced  to the cecum,                            identified by appendiceal orifice and ileocecal                            valve. The colonoscopy was performed without                            difficulty. The patient tolerated the procedure                            well. The quality of the bowel preparation was                            good. The ileocecal valve, appendiceal orifice, and                            rectum were photographed. Scope In: 10:58:50 AM Scope Out: 11:27:19 AM Scope Withdrawal Time: 0 hours 26 minutes 27 seconds  Total Procedure Duration: 0 hours 28 minutes 29 seconds  Findings:                 The digital rectal exam was normal.                           A 4 mm polyp was found in the cecum. The polyp was                            sessile. The polyp was removed with a cold snare.                            Resection and retrieval were complete.                           A 5 mm polyp was found in the ascending colon. The                            polyp was sessile. The polyp  was removed with a                            cold snare. Resection and retrieval were complete.                           A 10 mm polyp was found in the ascending colon. The                            polyp was sessile. The polyp was removed with a hot                            snare. Resection and retrieval were complete.                           Five sessile polyps were found in the transverse                            colon. The polyps were 5 to 9 mm in size. These                            polyps were removed with a cold snare. Resection                            and retrieval were complete.                           A 7 mm polyp was found in the descending colon. The                            polyp was sessile. The polyp was removed with a                            cold snare. Resection and retrieval were complete.                           Four sessile polyps were found in the  sigmoid                            colon. The polyps were 3 to 6 mm in size. These                            polyps were removed with a cold snare. Resection                            and retrieval were complete.                           A 14 mm polyp was found in the sigmoid colon. The                            polyp was semi-pedunculated. The polyp was removed  with a hot snare. Resection and retrieval were                            complete.                           Two sessile polyps were found in the rectum. The                            polyps were 3 to 4 mm in size. These polyps were                            removed with a cold snare. Resection and retrieval                            were complete.                           A few small-mouthed diverticula were found in the                            sigmoid colon, descending colon and transverse                            colon.                           The retroflexed view of the distal rectum and anal                            verge was normal and showed no anal or rectal                            abnormalities. Complications:            No immediate complications. Estimated Blood Loss:     Estimated blood loss was minimal. Impression:               - One 4 mm polyp in the cecum, removed with a cold                            snare. Resected and retrieved.                           - One 5 mm polyp in the ascending colon, removed                            with a cold snare. Resected and retrieved.                           - One 10 mm polyp in the ascending colon, removed                            with a hot snare. Resected and retrieved.                           -  Five 5 to 9 mm polyps in the transverse colon,                            removed with a cold snare. Resected and retrieved.                           - One 7 mm polyp in the descending colon, removed                             with a cold snare. Resected and retrieved.                           - Four 3 to 6 mm polyps in the sigmoid colon,                            removed with a cold snare. Resected and retrieved.                           - One 14 mm polyp in the sigmoid colon, removed                            with a hot snare. Resected and retrieved.                           - Two 3 to 4 mm polyps in the rectum, removed with                            a cold snare. Resected and retrieved.                           - Diverticulosis in the sigmoid colon, in the                            descending colon and in the transverse colon.                           - The distal rectum and anal verge are normal on                            retroflexion view. Recommendation:           - Patient has a contact number available for                            emergencies. The signs and symptoms of potential                            delayed complications were discussed with the                            patient. Return to normal activities tomorrow.  Written discharge instructions were provided to the                            patient.                           - Resume previous diet.                           - Continue present medications.                           - Await pathology results.                           - Repeat colonoscopy is recommended for                            surveillance of multiple polyps. The colonoscopy                            date will be determined after pathology results                            from today's exam become available for review.                           - No ibuprofen, naproxen, or other non-steroidal                            anti-inflammatory drugs for 2 weeks after polyp                            removal. Low dose aspirin okay (81 mg daily), but                            avoid additional aspirin for 2 weeks. Jerene Bears,  MD 11/27/2018 11:35:29 AM This report has been signed electronically.

## 2018-11-27 NOTE — Progress Notes (Signed)
Pt's states no medical or surgical changes since previsit or office visit. 

## 2018-11-28 ENCOUNTER — Telehealth: Payer: Self-pay

## 2018-11-28 NOTE — Telephone Encounter (Signed)
  Follow up Call-  Call back number 11/27/2018  Post procedure Call Back phone  # [phone]  Permission to leave phone message Yes  Some recent data might be hidden     Patient questions:  Do you have a fever, pain , or abdominal swelling? No. Pain Score  0 *  Have you tolerated food without any problems? Yes.    Have you been able to return to your normal activities? Yes.    Do you have any questions about your discharge instructions: Diet   No. Medications  No. Follow up visit  No.  Do you have questions or concerns about your Care? No.  Actions: * If pain score is 4 or above: No action needed, pain <4.

## 2018-12-05 ENCOUNTER — Encounter: Payer: Self-pay | Admitting: Internal Medicine

## 2018-12-31 ENCOUNTER — Ambulatory Visit: Payer: BC Managed Care – PPO | Admitting: Family Medicine

## 2019-01-07 ENCOUNTER — Ambulatory Visit: Payer: BC Managed Care – PPO | Admitting: Family Medicine

## 2019-01-07 ENCOUNTER — Encounter: Payer: Self-pay | Admitting: Family Medicine

## 2019-01-07 VITALS — BP 122/80 | HR 59 | Temp 98.7°F | Ht 68.0 in | Wt 216.5 lb

## 2019-01-07 DIAGNOSIS — R001 Bradycardia, unspecified: Secondary | ICD-10-CM | POA: Diagnosis not present

## 2019-01-07 DIAGNOSIS — E785 Hyperlipidemia, unspecified: Secondary | ICD-10-CM | POA: Diagnosis not present

## 2019-01-07 DIAGNOSIS — L989 Disorder of the skin and subcutaneous tissue, unspecified: Secondary | ICD-10-CM | POA: Diagnosis not present

## 2019-01-07 NOTE — Patient Instructions (Addendum)
We'll contact you with your lab report.  We make arrangements for referrals, extra imaging, and other appointments based on the urgency of the situation. Referrals are handled based on the clinical situation, not in the order that they are placed. If you do not see one of our referral coordinators on the way out of the clinic today, then you should expect a call in the next 1 to 2 weeks. We work diligently to process all referrals as quickly as possible.    Take care.  Glad to see you.

## 2019-01-07 NOTE — Progress Notes (Signed)
Bradycardia.  Not on any meds that would slow heart rate down.  H/o relatively low pulse prev noted here in clinic.  No CP.  He only gets SOB with sig exertion but that was expected by patient, given the level of exertion.    He had noted that with sig exertion, he still didn't have elevated pulse.  Highest pulse seen by patient was in the 80s, never in 90s with exercise.  No syncope.  No FCNAVD.   He feels occ skipping of beats, with a few extra beats added, then return to regular pulse.    On statin, recheck labs pending.  No ADE on med.   He had a skin lesion in the left temple that he wanted evaluated.    Meds, vitals, and allergies reviewed.   ROS: Per HPI unless specifically indicated in ROS section   GEN: nad, alert and oriented HEENT: mucous membranes moist NECK: supple w/o LA CV: Mild bradycardia but regular otherwise. PULM: ctab, no inc wob ABD: soft, +bs EXT: no edema SKIN: no acute rash but 2 lesions noted in the left temple. 5x82mm possible SK on the L temple 2x41mm possible SK on the L temple Both appear benign.

## 2019-01-08 LAB — CBC WITH DIFFERENTIAL/PLATELET
BASOS PCT: 1 % (ref 0.0–3.0)
Basophils Absolute: 0.1 10*3/uL (ref 0.0–0.1)
EOS ABS: 0.2 10*3/uL (ref 0.0–0.7)
Eosinophils Relative: 2.7 % (ref 0.0–5.0)
HEMATOCRIT: 40.8 % (ref 39.0–52.0)
HEMOGLOBIN: 13.6 g/dL (ref 13.0–17.0)
Lymphocytes Relative: 20.1 % (ref 12.0–46.0)
Lymphs Abs: 1.3 10*3/uL (ref 0.7–4.0)
MCHC: 33.4 g/dL (ref 30.0–36.0)
MCV: 85.6 fl (ref 78.0–100.0)
MONO ABS: 0.5 10*3/uL (ref 0.1–1.0)
Monocytes Relative: 8.2 % (ref 3.0–12.0)
NEUTROS ABS: 4.5 10*3/uL (ref 1.4–7.7)
Neutrophils Relative %: 68 % (ref 43.0–77.0)
PLATELETS: 230 10*3/uL (ref 150.0–400.0)
RBC: 4.77 Mil/uL (ref 4.22–5.81)
RDW: 14.3 % (ref 11.5–15.5)
WBC: 6.6 10*3/uL (ref 4.0–10.5)

## 2019-01-08 LAB — COMPREHENSIVE METABOLIC PANEL
ALT: 10 U/L (ref 0–53)
AST: 13 U/L (ref 0–37)
Albumin: 4.3 g/dL (ref 3.5–5.2)
Alkaline Phosphatase: 62 U/L (ref 39–117)
BUN: 19 mg/dL (ref 6–23)
CHLORIDE: 107 meq/L (ref 96–112)
CO2: 27 meq/L (ref 19–32)
CREATININE: 1.11 mg/dL (ref 0.40–1.50)
Calcium: 9.7 mg/dL (ref 8.4–10.5)
GFR: 68.33 mL/min (ref >60.00)
Glucose, Bld: 84 mg/dL (ref 70–99)
POTASSIUM: 4.3 meq/L (ref 3.5–5.1)
Sodium: 141 mEq/L (ref 135–145)
Total Bilirubin: 0.4 mg/dL (ref 0.2–1.2)
Total Protein: 6.7 g/dL (ref 6.0–8.3)

## 2019-01-08 LAB — LIPID PANEL
CHOL/HDL RATIO: 5
Cholesterol: 127 mg/dL (ref 0–200)
HDL: 25.6 mg/dL — ABNORMAL LOW (ref >39.00)
NONHDL: 101.52
TRIGLYCERIDES: 245 mg/dL — AB (ref 0.0–149.0)
VLDL: 49 mg/dL — ABNORMAL HIGH (ref 0.0–40.0)

## 2019-01-08 LAB — TSH: TSH: 0.57 u[IU]/mL (ref 0.35–4.50)

## 2019-01-08 LAB — LDL CHOLESTEROL, DIRECT: Direct LDL: 84 mg/dL

## 2019-01-09 DIAGNOSIS — R001 Bradycardia, unspecified: Secondary | ICD-10-CM | POA: Insufficient documentation

## 2019-01-09 DIAGNOSIS — L989 Disorder of the skin and subcutaneous tissue, unspecified: Secondary | ICD-10-CM | POA: Insufficient documentation

## 2019-01-09 NOTE — Assessment & Plan Note (Signed)
See notes on labs.  He is tolerating Lipitor.

## 2019-01-09 NOTE — Assessment & Plan Note (Signed)
My concern is not his resting bradycardia.  the issue may be his relative lack of significant heart rate increase during exercise.  He does not have syncope.  Routine cautions given.  I do not want him to exercise hard in the meantime.  EKG discussed with patient, no acute changes.  Check routine labs and refer to cardiology.  He agrees with plan.  >25 minutes spent in face to face time with patient, >50% spent in counselling or coordination of care.

## 2019-01-09 NOTE — Assessment & Plan Note (Signed)
Likely benign but refer to dermatology.  He agrees.

## 2019-01-10 NOTE — Progress Notes (Signed)
Cardiology Office Note   Date:  01/13/2019   ID:  Erik Mitchell, DOB 20-May-1961, MRN [id number]  PCP:  Tonia Ghent, MD  Cardiologist:   Jenkins Rouge, MD   No chief complaint on file.     History of Present Illness: Erik Mitchell is a 58 y.o. male who presents for consultation regarding bradycardia. Referred by Dr Damita Dunnings CRF;s include HLD on statin. On no AV nodal blocking drugs normal renal function and K. No chest pain or syncope. Occasional palpitations. None are sustained Some exertional dyspnea but only as expected for sedentary lifestyle He indicates that his pulse rarely get in the 90's His ECG is normal SR rate 55 normal intervals and normal QT Done on 01/08/19 and reviewed. He had a normal ETT in 96 and normal myovue in 2003 with EF 67%  labs are normal including TSH.   Activity level has declined over last few months no syncope Wife indicates HR can be in 40's at night sleeping    Past Medical History:  Diagnosis Date  . Allergic rhinitis 02/16/2003  . Allergy   . Anxiety   . GERD (gastroesophageal reflux disease) 12/18/2002  . H/O cold sores    valtrex suppression  . History of ETT 11/96   wnl, cardiology consult0 [facility] 01/97  . Hyperlipemia 08/19/1995  . Hypertension 08/19/95  . Shingles     Past Surgical History:  Procedure Laterality Date  . TENDON REPAIR     R 4th finger  . TONSILLECTOMY       Current Outpatient Medications  Medication Sig Dispense Refill  . aspirin 81 MG tablet Take 81 mg by mouth daily.      Marland Kitchen atorvastatin (LIPITOR) 10 MG tablet TAKE 1 TABLET NIGHTLY AT BEDTIME 90 tablet 3  . esomeprazole (NEXIUM) 40 MG capsule Take 1 capsule (40 mg total) by mouth daily. 90 capsule 3  . valACYclovir (VALTREX) 500 MG tablet Take 1 tablet (500 mg total) by mouth daily. 90 tablet 3   Current Facility-Administered Medications  Medication Dose Route Frequency Provider Last Rate Last Dose  . 0.9 %  sodium chloride infusion  500 mL  Intravenous Once Pyrtle, Lajuan Lines, MD        Allergies:   Bee venom; Meloxicam; and Naproxen    Social History:  The patient  reports that he has never smoked. He has never used smokeless tobacco. He reports current alcohol use. He reports that he does not use drugs.   Family History:  The patient's family history includes Cancer in his mother; Colon cancer in his mother; Diabetes in his brother and mother; Heart disease in his brother, father, and mother.    ROS:  Please see the history of present illness.   Otherwise, review of systems are positive for none.   All other systems are reviewed and negative.    PHYSICAL EXAM: VS:  BP 124/88   Pulse (!) 56   Ht 5\' 8"  (1.727 m)   Wt 210 lb (95.3 kg)   BMI 31.93 kg/m  , BMI Body mass index is 31.93 kg/m. Affect appropriate Healthy:  appears stated age 56: normal Neck supple with no adenopathy JVP normal no bruits no thyromegaly Lungs clear with no wheezing and good diaphragmatic motion Heart:  S1/S2 no murmur, no rub, gallop or click PMI normal Abdomen: benighn, BS positve, no tenderness, no AAA no bruit.  No HSM or HJR Distal pulses intact with no bruits No edema Neuro non-focal  Skin warm and dry No muscular weakness    EKG:  See HPI   Recent Labs: 01/07/2019: ALT 10; BUN 19; Creatinine, Ser 1.11; Hemoglobin 13.6; Platelets 230.0; Potassium 4.3; Sodium 141; TSH 0.57    Lipid Panel    Component Value Date/Time   CHOL 127 01/07/2019 1652   TRIG 245.0 (H) 01/07/2019 1652   HDL 25.60 (L) 01/07/2019 1652   CHOLHDL 5 01/07/2019 1652   VLDL 49.0 (H) 01/07/2019 1652   LDLCALC 157 (H) 10/04/2018 1550   LDLDIRECT 84.0 01/07/2019 1652      Wt Readings from Last 3 Encounters:  01/13/19 210 lb (95.3 kg)  01/07/19 216 lb 8 oz (98.2 kg)  11/27/18 215 lb (97.5 kg)      Other studies Reviewed: Additional studies/ records that were reviewed today include: notes from primary labs ecg and myovue 2003.    ASSESSMENT AND  PLAN:  1.  Bradycardia:  With concern for chronotropic incompetence will order cardiopulmonary stress test to see if functional Activity limited by HR. Wanted to order cardiac MRI to r/o infiltrative process that could be affecting sinus node but he is severely claustrophobic and doubts he could do it will order TTE to r/o structural heart dx 2. HLD:  Continue statin labs with primary . Consider calcium score as LdL is still 157 despite statin    Current medicines are reviewed at length with the patient today.  The patient does not have concerns regarding medicines.  The following changes have been made:  no change  Labs/ tests ordered today include: cardiopulmonary stress test, Cardiac MRI  Orders Placed This Encounter  Procedures  . Cardiopulmonary exercise test  . ECHOCARDIOGRAM COMPLETE     Disposition:   FU with cardiology pending results of test refer to EP if evidence of chronotropic incompetence      Signed, Jenkins Rouge, MD  01/13/2019 3:51 PM    [facility] [place], [place], [place]  [address] Phone: [phone]; Fax: [phone]

## 2019-01-13 ENCOUNTER — Ambulatory Visit: Payer: BC Managed Care – PPO | Admitting: Cardiovascular Disease

## 2019-01-13 VITALS — BP 124/88 | HR 56 | Ht 68.0 in | Wt 210.0 lb

## 2019-01-13 DIAGNOSIS — E785 Hyperlipidemia, unspecified: Secondary | ICD-10-CM

## 2019-01-13 DIAGNOSIS — R001 Bradycardia, unspecified: Secondary | ICD-10-CM | POA: Diagnosis not present

## 2019-01-13 NOTE — Patient Instructions (Addendum)
Medication Instructions:   If you need a refill on your cardiac medications before your next appointment, please call your pharmacy.   Lab work:  If you have labs (blood work) drawn today and your tests are completely normal, you will receive your results only by: Marland Kitchen MyChart Message (if you have MyChart) OR . A paper copy in the mail If you have any lab test that is abnormal or we need to change your treatment, we will call you to review the results.  Testing/Procedures: Your physician has recommended that you have a cardiopulmonary stress test (CPX). CPX testing is a non-invasive measurement of heart and lung function. It replaces a traditional treadmill stress test. This type of test provides a tremendous amount of information that relates not only to your present condition but also for future outcomes. This test combines measurements of you ventilation, respiratory gas exchange in the lungs, electrocardiogram (EKG), blood pressure and physical response before, during, and following an exercise protocol.  Your physician has requested that you have an echocardiogram. Echocardiography is a painless test that uses sound waves to create images of your heart. It provides your doctor with information about the size and shape of your heart and how well your heart's chambers and valves are working. This procedure takes approximately one hour. There are no restrictions for this procedure.  Follow-Up: At [facility], you and your health needs are our priority.  As part of our continuing mission to provide you with exceptional heart care, we have created designated Provider Care Teams.  These Care Teams include your primary Cardiologist (physician) and Advanced Practice Providers (APPs -  Physician Assistants and Nurse Practitioners) who all work together to provide you with the care you need, when you need it. You will need a follow up appointment after echocardiogram and stress test. You may see Dr.  Johnsie Cancel or one of the following Advanced Practice Providers on your designated Care Team:   Truitt Merle, NP Cecilie Kicks, NP . Kathyrn Drown, NP

## 2019-01-21 ENCOUNTER — Ambulatory Visit (HOSPITAL_COMMUNITY): Payer: BC Managed Care – PPO | Attending: Cardiovascular Disease

## 2019-01-21 DIAGNOSIS — E785 Hyperlipidemia, unspecified: Secondary | ICD-10-CM

## 2019-01-21 DIAGNOSIS — R001 Bradycardia, unspecified: Secondary | ICD-10-CM

## 2019-01-22 DIAGNOSIS — R001 Bradycardia, unspecified: Secondary | ICD-10-CM | POA: Diagnosis not present

## 2019-01-22 NOTE — Progress Notes (Deleted)
Cardiology Office Note   Date:  01/22/2019   ID:  Erik Mitchell, DOB 13-Jan-1961, MRN [id number]  PCP:  Tonia Ghent, MD  Cardiologist:   Jenkins Rouge, MD   No chief complaint on file.     History of Present Illness: Erik Mitchell is a 58 y.o. male who presents for consultation regarding bradycardia. Referred by Dr Damita Dunnings CRF;s include HLD on statin. On no AV nodal blocking drugs normal renal function and K. No chest pain or syncope. Occasional palpitations. None are sustained Some exertional dyspnea but only as expected for sedentary lifestyle He indicates that his pulse rarely get in the 90's His ECG is normal SR rate 55 normal intervals and normal QT Done on 01/08/19 and reviewed. He had a normal ETT in 96 and normal myovue in 2003 with EF 67%  labs are normal including TSH.   Activity level has declined over last few months no syncope Wife indicates HR can be in 40's at night sleeping   Had cardiopulmonary stress test 01/21/19 Was upset that study done on treadmill and not bicycle   *** TTE:  reveiwed ***  Past Medical History:  Diagnosis Date  . Allergic rhinitis 02/16/2003  . Allergy   . Anxiety   . GERD (gastroesophageal reflux disease) 12/18/2002  . H/O cold sores    valtrex suppression  . History of ETT 11/96   wnl, cardiology consult0 [facility] 01/97  . Hyperlipemia 08/19/1995  . Hypertension 08/19/95  . Shingles     Past Surgical History:  Procedure Laterality Date  . TENDON REPAIR     R 4th finger  . TONSILLECTOMY       Current Outpatient Medications  Medication Sig Dispense Refill  . aspirin 81 MG tablet Take 81 mg by mouth daily.      Marland Kitchen atorvastatin (LIPITOR) 10 MG tablet TAKE 1 TABLET NIGHTLY AT BEDTIME 90 tablet 3  . esomeprazole (NEXIUM) 40 MG capsule Take 1 capsule (40 mg total) by mouth daily. 90 capsule 3  . valACYclovir (VALTREX) 500 MG tablet Take 1 tablet (500 mg total) by mouth daily. 90 tablet 3   Current Facility-Administered  Medications  Medication Dose Route Frequency Provider Last Rate Last Dose  . 0.9 %  sodium chloride infusion  500 mL Intravenous Once Pyrtle, Lajuan Lines, MD        Allergies:   Bee venom; Meloxicam; and Naproxen    Social History:  The patient  reports that he has never smoked. He has never used smokeless tobacco. He reports current alcohol use. He reports that he does not use drugs.   Family History:  The patient's family history includes Cancer in his mother; Colon cancer in his mother; Diabetes in his brother and mother; Heart disease in his brother, father, and mother.    ROS:  Please see the history of present illness.   Otherwise, review of systems are positive for none.   All other systems are reviewed and negative.    PHYSICAL EXAM: VS:  There were no vitals taken for this visit. , BMI There is no height or weight on file to calculate BMI. Affect appropriate Healthy:  appears stated age 65: normal Neck supple with no adenopathy JVP normal no bruits no thyromegaly Lungs clear with no wheezing and good diaphragmatic motion Heart:  S1/S2 no murmur, no rub, gallop or click PMI normal Abdomen: benighn, BS positve, no tenderness, no AAA no bruit.  No HSM or HJR Distal pulses  intact with no bruits No edema Neuro non-focal Skin warm and dry No muscular weakness    EKG:  See HPI   Recent Labs: 01/07/2019: ALT 10; BUN 19; Creatinine, Ser 1.11; Hemoglobin 13.6; Platelets 230.0; Potassium 4.3; Sodium 141; TSH 0.57    Lipid Panel    Component Value Date/Time   CHOL 127 01/07/2019 1652   TRIG 245.0 (H) 01/07/2019 1652   HDL 25.60 (L) 01/07/2019 1652   CHOLHDL 5 01/07/2019 1652   VLDL 49.0 (H) 01/07/2019 1652   LDLCALC 157 (H) 10/04/2018 1550   LDLDIRECT 84.0 01/07/2019 1652      Wt Readings from Last 3 Encounters:  01/13/19 210 lb (95.3 kg)  01/07/19 216 lb 8 oz (98.2 kg)  11/27/18 215 lb (97.5 kg)      Other studies Reviewed: Additional studies/ records that  were reviewed today include: notes from primary labs ecg and myovue 2003.    ASSESSMENT AND PLAN:  1.  Bradycardia:  With concern for chronotropic incompetence  Wanted to order cardiac MRI to r/o infiltrative process that could be affecting sinus node but he is severely claustrophobic and doubts he could do it will order TTE to r/o structural heart dx 2. HLD:  Continue statin labs with primary . Consider calcium score as LdL is still 157 despite statin    Current medicines are reviewed at length with the patient today.  The patient does not have concerns regarding medicines.  The following changes have been made:  no change  Labs/ tests ordered today include: ***  No orders of the defined types were placed in this encounter.    Disposition:   FU ***    Signed, Jenkins Rouge, MD  01/22/2019 9:31 AM    [facility] [place], [place], [place]  [address] Phone: [phone]; Fax: [phone]

## 2019-01-23 ENCOUNTER — Other Ambulatory Visit (HOSPITAL_COMMUNITY): Payer: BC Managed Care – PPO

## 2019-01-23 ENCOUNTER — Ambulatory Visit (HOSPITAL_COMMUNITY): Payer: BC Managed Care – PPO | Attending: Cardiology

## 2019-01-23 DIAGNOSIS — E785 Hyperlipidemia, unspecified: Secondary | ICD-10-CM | POA: Insufficient documentation

## 2019-01-23 DIAGNOSIS — R001 Bradycardia, unspecified: Secondary | ICD-10-CM | POA: Insufficient documentation

## 2019-01-28 ENCOUNTER — Ambulatory Visit: Payer: BC Managed Care – PPO | Admitting: Cardiovascular Disease

## 2019-02-14 NOTE — Progress Notes (Signed)
Cardiology Office Note   Date:  02/19/2019   ID:  ERROL ALA, DOB Jul 22, 1961, MRN [id number]  PCP:  Tonia Ghent, MD  Cardiologist:   Jenkins Rouge, MD   No chief complaint on file.     History of Present Illness: Erik Mitchell is a 58 y.o. male who presents for consultation regarding bradycardia. Referred by Dr Damita Dunnings CRF;s include HLD on statin. On no AV nodal blocking drugs normal renal function and K. No chest pain or syncope. Occasional palpitations. None are sustained Some exertional dyspnea but only as expected for sedentary lifestyle He indicates that his pulse rarely get in the 90's His ECG is normal SR rate 55 normal intervals and normal QT Done on 01/08/19 and reviewed. He had a normal ETT in 96 and normal myovue in 2003 with EF 67%  labs are normal including TSH.   Activity level has declined over last few months no syncope Wife indicates HR can be in 40's at night sleeping   Had cardiopulmonary stress test 01/21/19 Was upset that study done on treadmill and not bicycle   Normal with HR max 146 bpm 90% PMHR   TTE: 01/23/19   reveiwed normal EF 60-65% aortic root indicated mildly dilated 40 mm will need CTA 01/2020   Tried to appease them regarding their interaction with the cardiopulmonary stress test but they seemed Very upset. The patients wife in particular does not seem to be a fan of our system  Past Medical History:  Diagnosis Date  . Allergic rhinitis 02/16/2003  . Allergy   . Anxiety   . GERD (gastroesophageal reflux disease) 12/18/2002  . H/O cold sores    valtrex suppression  . History of ETT 11/96   wnl, cardiology consult0 [facility] 01/97  . Hyperlipemia 08/19/1995  . Hypertension 08/19/95  . Shingles     Past Surgical History:  Procedure Laterality Date  . TENDON REPAIR     R 4th finger  . TONSILLECTOMY       Current Outpatient Medications  Medication Sig Dispense Refill  . aspirin 81 MG tablet Take 81 mg by mouth daily.      Marland Kitchen  atorvastatin (LIPITOR) 10 MG tablet TAKE 1 TABLET NIGHTLY AT BEDTIME 90 tablet 3  . esomeprazole (NEXIUM) 40 MG capsule Take 1 capsule (40 mg total) by mouth daily. 90 capsule 3  . valACYclovir (VALTREX) 500 MG tablet Take 1 tablet (500 mg total) by mouth daily. 90 tablet 3   Current Facility-Administered Medications  Medication Dose Route Frequency Provider Last Rate Last Dose  . 0.9 %  sodium chloride infusion  500 mL Intravenous Once Pyrtle, Lajuan Lines, MD        Allergies:   Bee venom; Meloxicam; and Naproxen    Social History:  The patient  reports that he has never smoked. He has never used smokeless tobacco. He reports current alcohol use. He reports that he does not use drugs.   Family History:  The patient's family history includes Cancer in his mother; Colon cancer in his mother; Diabetes in his brother and mother; Heart disease in his brother, father, and mother.    ROS:  Please see the history of present illness.   Otherwise, review of systems are positive for none.   All other systems are reviewed and negative.    PHYSICAL EXAM: VS:  BP 138/72   Pulse 63   Ht 5\' 8"  (1.727 m)   Wt 95.8 kg   SpO2  99%   BMI 32.10 kg/m  , BMI Body mass index is 32.1 kg/m. Affect appropriate Healthy:  appears stated age 59: normal Neck supple with no adenopathy JVP normal no bruits no thyromegaly Lungs clear with no wheezing and good diaphragmatic motion Heart:  S1/S2 no murmur, no rub, gallop or click PMI normal Abdomen: benighn, BS positve, no tenderness, no AAA no bruit.  No HSM or HJR Distal pulses intact with no bruits No edema Neuro non-focal Skin warm and dry No muscular weakness    EKG:  See HPI   Recent Labs: 01/07/2019: ALT 10; BUN 19; Creatinine, Ser 1.11; Hemoglobin 13.6; Platelets 230.0; Potassium 4.3; Sodium 141; TSH 0.57    Lipid Panel    Component Value Date/Time   CHOL 127 01/07/2019 1652   TRIG 245.0 (H) 01/07/2019 1652   HDL 25.60 (L) 01/07/2019 1652     CHOLHDL 5 01/07/2019 1652   VLDL 49.0 (H) 01/07/2019 1652   LDLCALC 157 (H) 10/04/2018 1550   LDLDIRECT 84.0 01/07/2019 1652      Wt Readings from Last 3 Encounters:  02/19/19 95.8 kg  01/13/19 95.3 kg  01/07/19 98.2 kg      Other studies Reviewed: Additional studies/ records that were reviewed today include: notes from primary labs ecg and myovue 2003.    ASSESSMENT AND PLAN:  1.  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 HLD:  Continue statin labs with primary . Consider calcium score as LdL is still 157 despite statin  3. Dilated Aortic Root:  4.0 by TTE 01/23/19 will order CTA February 2021 for f/u   Current medicines are reviewed at length with the patient today.  The patient does not have concerns regarding medicines.  The following changes have been made:  no change  Labs/ tests ordered today include: CTA 01/2020 for aortic root dilatation   Orders Placed This Encounter  Procedures  . CT ANGIO CHEST AORTA W &/OR WO CONTRAST  . Basic metabolic panel     Disposition:   FU in a year with CTA     Signed, Jenkins Rouge, MD  02/19/2019 4:57 PM    [facility] [address], [place], [place]  [address] Phone: [phone]; Fax: [phone]

## 2019-02-19 ENCOUNTER — Ambulatory Visit (INDEPENDENT_AMBULATORY_CARE_PROVIDER_SITE_OTHER): Payer: BC Managed Care – PPO | Admitting: Cardiovascular Disease

## 2019-02-19 ENCOUNTER — Encounter: Payer: Self-pay | Admitting: Cardiovascular Disease

## 2019-02-19 VITALS — BP 138/72 | HR 63 | Ht 68.0 in | Wt 211.1 lb

## 2019-02-19 DIAGNOSIS — I77810 Thoracic aortic ectasia: Secondary | ICD-10-CM | POA: Diagnosis not present

## 2019-02-19 DIAGNOSIS — R001 Bradycardia, unspecified: Secondary | ICD-10-CM | POA: Diagnosis not present

## 2019-02-19 DIAGNOSIS — E785 Hyperlipidemia, unspecified: Secondary | ICD-10-CM | POA: Diagnosis not present

## 2019-02-19 NOTE — Patient Instructions (Addendum)
Medication Instructions:  If you need a refill on your cardiac medications before your next appointment, please call your pharmacy.   Lab work: Your physician recommends that you have lab work before your CTA in February.  BMET  If you have labs (blood work) drawn today and your tests are completely normal, you will receive your results only by: Marland Kitchen MyChart Message (if you have MyChart) OR . A paper copy in the mail If you have any lab test that is abnormal or we need to change your treatment, we will call you to review the results.  Testing/Procedures: Cardiac CTA scanning, (CAT scanning), is a noninvasive, special x-ray that produces cross-sectional images of the body using x-rays and a computer. CT scans help physicians diagnose and treat medical conditions. For some CT exams, a contrast material is used to enhance visibility in the area of the body being studied. CT scans provide greater clarity and reveal more details than regular x-ray exams.  Follow-Up: At [facility], you and your health needs are our priority.  As part of our continuing mission to provide you with exceptional heart care, we have created designated Provider Care Teams.  These Care Teams include your primary Cardiologist (physician) and Advanced Practice Providers (APPs -  Physician Assistants and Nurse Practitioners) who all work together to provide you with the care you need, when you need it. You will need a follow up appointment in 12 months.  Please call our office 2 months in advance to schedule this appointment.  You may see Dr. Johnsie Cancel or one of the following Advanced Practice Providers on your designated Care Team:   Truitt Merle, NP Cecilie Kicks, NP . Kathyrn Drown, NP

## 2019-02-20 ENCOUNTER — Ambulatory Visit: Payer: BC Managed Care – PPO | Admitting: Psychology

## 2019-02-20 DIAGNOSIS — F418 Other specified anxiety disorders: Secondary | ICD-10-CM

## 2019-03-04 ENCOUNTER — Ambulatory Visit: Payer: BC Managed Care – PPO | Admitting: Family Medicine

## 2019-03-04 ENCOUNTER — Telehealth: Payer: Self-pay | Admitting: *Deleted

## 2019-03-04 ENCOUNTER — Encounter: Payer: Self-pay | Admitting: Family Medicine

## 2019-03-04 NOTE — Telephone Encounter (Signed)
I can call him then.  Thanks.

## 2019-03-04 NOTE — Telephone Encounter (Signed)
Patient's wife called to say that a phone call this afternoon would be fine.  Patient will be home anytime after 3:30 pm and he and his wife can speak to you on speaker phone.  In the meantime, the wife is going to send in some questions that they have through MyChart so that you will have a heads up on what they have in mind.

## 2019-03-05 NOTE — Telephone Encounter (Signed)
Late entry.  I called and talked to patient at the time of his scheduled office visit.  He passed his stress test and did not have need for pacer placement, based on the available results. Aortic root imaging discussed with patient.  Reasonable to recheck in 1 year.  He is low risk for complications in the meantime.  Cardiology recommendations are reasonable.  Routine cautions given, discussed.  Understood by patient.  Previous evaluation years ago did not measure his aortic root for comparison.  He has previous testing was not designed to measure his aortic root.  He can call about seeing a different cardiologist if he desires.  He was not having any chest pain at the time of the call but given that he had had chest pains and shortness of breath previously, and I wanted him to call the triage line at cardiology and get their advice.  He said he would do that when he got off the phone with me.  I appreciate the help of all involved.  I will await cardiology input.

## 2019-03-06 ENCOUNTER — Ambulatory Visit: Payer: BC Managed Care – PPO | Admitting: Psychology

## 2019-03-06 ENCOUNTER — Other Ambulatory Visit: Payer: Self-pay

## 2019-03-06 DIAGNOSIS — F418 Other specified anxiety disorders: Secondary | ICD-10-CM

## 2019-03-18 ENCOUNTER — Ambulatory Visit (INDEPENDENT_AMBULATORY_CARE_PROVIDER_SITE_OTHER): Payer: BC Managed Care – PPO | Admitting: Psychology

## 2019-03-18 DIAGNOSIS — F418 Other specified anxiety disorders: Secondary | ICD-10-CM | POA: Diagnosis not present

## 2019-04-03 ENCOUNTER — Ambulatory Visit (INDEPENDENT_AMBULATORY_CARE_PROVIDER_SITE_OTHER): Payer: BC Managed Care – PPO | Admitting: Psychology

## 2019-04-03 DIAGNOSIS — F418 Other specified anxiety disorders: Secondary | ICD-10-CM

## 2019-04-11 ENCOUNTER — Telehealth: Payer: Self-pay

## 2019-04-11 NOTE — Telephone Encounter (Signed)
Received a fax from [facility] that patient's appointment was cancelled and Dr. Damita Dunnings said defer to patient and to tell patient let us know if he needs a referral for this again in the future. Left message on home number for patient to call back. Mobile number is not a working number, need to update this

## 2019-04-17 ENCOUNTER — Ambulatory Visit (INDEPENDENT_AMBULATORY_CARE_PROVIDER_SITE_OTHER): Payer: BC Managed Care – PPO | Admitting: Psychology

## 2019-04-17 DIAGNOSIS — F418 Other specified anxiety disorders: Secondary | ICD-10-CM

## 2019-04-24 ENCOUNTER — Ambulatory Visit (INDEPENDENT_AMBULATORY_CARE_PROVIDER_SITE_OTHER): Payer: BC Managed Care – PPO | Admitting: Psychology

## 2019-04-24 DIAGNOSIS — F418 Other specified anxiety disorders: Secondary | ICD-10-CM

## 2019-05-08 ENCOUNTER — Ambulatory Visit (INDEPENDENT_AMBULATORY_CARE_PROVIDER_SITE_OTHER): Payer: BC Managed Care – PPO | Admitting: Psychology

## 2019-05-08 DIAGNOSIS — F418 Other specified anxiety disorders: Secondary | ICD-10-CM | POA: Diagnosis not present

## 2019-05-22 ENCOUNTER — Ambulatory Visit (INDEPENDENT_AMBULATORY_CARE_PROVIDER_SITE_OTHER): Payer: BC Managed Care – PPO | Admitting: Psychology

## 2019-05-22 DIAGNOSIS — F418 Other specified anxiety disorders: Secondary | ICD-10-CM

## 2019-06-12 ENCOUNTER — Ambulatory Visit: Payer: BC Managed Care – PPO | Admitting: Psychology

## 2019-06-26 ENCOUNTER — Ambulatory Visit: Payer: BC Managed Care – PPO | Admitting: Psychology

## 2019-08-31 ENCOUNTER — Encounter: Payer: Self-pay | Admitting: Family Medicine

## 2019-08-31 ENCOUNTER — Other Ambulatory Visit: Payer: Self-pay | Admitting: Family Medicine

## 2019-09-03 ENCOUNTER — Telehealth: Payer: Self-pay | Admitting: Family Medicine

## 2019-09-03 NOTE — Telephone Encounter (Signed)
Please check with patient about nexium refill, it looks like it was already sent.  Thanks.

## 2019-09-04 NOTE — Telephone Encounter (Signed)
Patient advised.

## 2019-10-13 ENCOUNTER — Other Ambulatory Visit: Payer: Self-pay | Admitting: Family Medicine

## 2019-12-04 ENCOUNTER — Encounter: Payer: Self-pay | Admitting: Internal Medicine

## 2020-01-08 ENCOUNTER — Other Ambulatory Visit: Payer: Self-pay | Admitting: Family Medicine

## 2020-01-08 NOTE — Telephone Encounter (Signed)
Patient scheduled.

## 2020-01-08 NOTE — Telephone Encounter (Signed)
Please schedule CPE with fasting labs prior for Dr. Damita Dunnings.  Then send back to C[facility] to refill medication.

## 2020-01-08 NOTE — Telephone Encounter (Signed)
Med refilled.

## 2020-01-11 ENCOUNTER — Other Ambulatory Visit: Payer: Self-pay | Admitting: Family Medicine

## 2020-01-12 ENCOUNTER — Other Ambulatory Visit (INDEPENDENT_AMBULATORY_CARE_PROVIDER_SITE_OTHER): Payer: BC Managed Care – PPO

## 2020-01-12 ENCOUNTER — Other Ambulatory Visit: Payer: Self-pay

## 2020-01-12 ENCOUNTER — Other Ambulatory Visit: Payer: Self-pay | Admitting: Family Medicine

## 2020-01-12 DIAGNOSIS — E785 Hyperlipidemia, unspecified: Secondary | ICD-10-CM | POA: Diagnosis not present

## 2020-01-12 DIAGNOSIS — I77810 Thoracic aortic ectasia: Secondary | ICD-10-CM

## 2020-01-12 DIAGNOSIS — R001 Bradycardia, unspecified: Secondary | ICD-10-CM

## 2020-01-12 NOTE — Addendum Note (Signed)
Addended by: Ellamae Sia on: 01/12/2020 03:33 PM   Modules accepted: Orders

## 2020-01-13 ENCOUNTER — Encounter: Payer: Self-pay | Admitting: Family Medicine

## 2020-01-13 LAB — LIPID PANEL
Cholesterol: 160 mg/dL (ref 0–200)
HDL: 34.8 mg/dL — ABNORMAL LOW (ref >39.00)
LDL Cholesterol: 108 mg/dL — ABNORMAL HIGH (ref 0–99)
NonHDL: 124.93
Total CHOL/HDL Ratio: 5
Triglycerides: 84 mg/dL (ref 0.0–149.0)
VLDL: 16.8 mg/dL (ref 0.0–40.0)

## 2020-01-13 LAB — COMPREHENSIVE METABOLIC PANEL
ALT: 14 U/L (ref 0–53)
AST: 17 U/L (ref 0–37)
Albumin: 4.5 g/dL (ref 3.5–5.2)
Alkaline Phosphatase: 69 U/L (ref 39–117)
BUN: 18 mg/dL (ref 6–23)
CO2: 26 mEq/L (ref 19–32)
Calcium: 9.5 mg/dL (ref 8.4–10.5)
Chloride: 105 mEq/L (ref 96–112)
Creatinine, Ser: 0.95 mg/dL (ref 0.40–1.50)
GFR: 81.48 mL/min (ref >60.00)
Glucose, Bld: 89 mg/dL (ref 70–99)
Potassium: 4.2 mEq/L (ref 3.5–5.1)
Sodium: 140 mEq/L (ref 135–145)
Total Bilirubin: 0.5 mg/dL (ref 0.2–1.2)
Total Protein: 6.6 g/dL (ref 6.0–8.3)

## 2020-01-19 ENCOUNTER — Other Ambulatory Visit: Payer: Self-pay

## 2020-01-19 ENCOUNTER — Encounter: Payer: Self-pay | Admitting: Family Medicine

## 2020-01-19 ENCOUNTER — Ambulatory Visit (INDEPENDENT_AMBULATORY_CARE_PROVIDER_SITE_OTHER): Payer: BC Managed Care – PPO | Admitting: Family Medicine

## 2020-01-19 VITALS — BP 122/80 | HR 63 | Temp 97.3°F | Ht 68.0 in | Wt 230.5 lb

## 2020-01-19 DIAGNOSIS — Z0000 Encounter for general adult medical examination without abnormal findings: Secondary | ICD-10-CM | POA: Diagnosis not present

## 2020-01-19 DIAGNOSIS — E785 Hyperlipidemia, unspecified: Secondary | ICD-10-CM

## 2020-01-19 DIAGNOSIS — K219 Gastro-esophageal reflux disease without esophagitis: Secondary | ICD-10-CM

## 2020-01-19 DIAGNOSIS — Z23 Encounter for immunization: Secondary | ICD-10-CM | POA: Diagnosis not present

## 2020-01-19 DIAGNOSIS — Z7189 Other specified counseling: Secondary | ICD-10-CM

## 2020-01-19 DIAGNOSIS — B009 Herpesviral infection, unspecified: Secondary | ICD-10-CM

## 2020-01-19 MED ORDER — ATORVASTATIN CALCIUM 10 MG PO TABS: 10.0000 mg | ORAL_TABLET | Freq: Every day | ORAL | 3 refills | Status: DC

## 2020-01-19 MED ORDER — VALACYCLOVIR HCL 500 MG PO TABS: 500.0000 mg | ORAL_TABLET | Freq: Every day | ORAL | 3 refills | Status: DC

## 2020-01-19 NOTE — Progress Notes (Signed)
This visit occurred during the SARS-CoV-2 public health emergency.  Safety protocols were in place, including screening questions prior to the visit, additional usage of staff PPE, and extensive cleaning of exam room while observing appropriate contact time as indicated for disinfecting solutions.  CPE- See plan.  Routine anticipatory guidance given to patient.  See health maintenance.  The possibility exists that previously documented standard health maintenance information may have been brought forward from a previous encounter into this note.  If needed, that same information has been updated to reflect the current situation based on today's encounter.    Tetanus 2015 Flutoday.   PNA 2009 Shingles d/w pt.   covid vaccine d/w pt.  Colon cancer screening d/w pt.  He'll call about follow up with GI.  Prostate cancer screening and PSA options(with potential risks and benefits of testing vs not testing) were discussed along with recent recs/guidelines. He declined testing PSAat this point. Living will d/w pt. Wife designated if patient were incapacitated.  Diet and exercise d/w pt.Encouraged both. he has been walking for exercise.  Intentional weight loss.   HCV neg. D/w pt.  HIV prev neg in ~1996 per patient report.  Elevated Cholesterol: Using medications without problems: yes Muscle aches: no Diet compliance: yes Exercise:yes Labs d/w pt.    Hx of HTN.  Off med and BP is normal. dw pt.   GERD.  Still on PPI.  With some relief.  no ADE on med.   Dicussed trial of slow taper.    Was prev on valtrex daily, had flares when he stopped med, with cold sore eruption.  He has done better on daily medicine.   PMH and SH reviewed  Meds, vitals, and allergies reviewed.   ROS: Per HPI.  Unless specifically indicated otherwise in HPI, the patient denies:  General: fever. Eyes: acute vision changes ENT: sore throat Cardiovascular: chest pain Respiratory: SOB GI: vomiting GU:  dysuria Musculoskeletal: acute back pain Derm: acute rash Neuro: acute motor dysfunction Psych: worsening mood Endocrine: polydipsia Heme: bleeding Allergy: hayfever  GEN: nad, alert and oriented HEENT: ncat NECK: supple w/o LA CV: rrr. PULM: ctab, no inc wob ABD: soft, +bs EXT: no edema SKIN: no acute rash

## 2020-01-19 NOTE — Patient Instructions (Addendum)
Check with your insurance to see if they will cover the shingles shot. Flu shot today.  Update me as needed.  Take care.  Glad to see you. Thanks for your effort.

## 2020-01-20 ENCOUNTER — Encounter: Payer: Self-pay | Admitting: *Deleted

## 2020-01-21 NOTE — Assessment & Plan Note (Signed)
No change in meds for now.  He can work more diet and exercise.  Update me as needed.  He agrees.

## 2020-01-21 NOTE — Assessment & Plan Note (Signed)
Was prev on valtrex daily, had flares when he stopped med, with cold sore eruption.  He has done better on daily medicine.  Continue as is, daily treatment.

## 2020-01-21 NOTE — Assessment & Plan Note (Signed)
Still on PPI.  With some relief.  no ADE on med.   Dicussed trial of slow taper.

## 2020-01-21 NOTE — Assessment & Plan Note (Signed)
Tetanus 2015 Flutoday.   PNA 2009 Shingles d/w pt.   covid vaccine d/w pt.  Colon cancer screening d/w pt.  He'll call about follow up with GI.  Prostate cancer screening and PSA options(with potential risks and benefits of testing vs not testing) were discussed along with recent recs/guidelines. He declined testing PSAat this point. Living will d/w pt. Wife designated if patient were incapacitated.  Diet and exercise d/w pt.Encouraged both. he has been walking for exercise.  Intentional weight loss.   HCV neg. D/w pt.  HIV prev neg in ~1996 per patient report.

## 2020-01-21 NOTE — Assessment & Plan Note (Signed)
Living will d/w pt.  Wife designated if patient were incapacitated.   ?

## 2020-02-04 ENCOUNTER — Telehealth: Payer: Self-pay | Admitting: *Deleted

## 2020-02-04 NOTE — Telephone Encounter (Signed)
Called patient to schedule CT Angio Chest Aorta for Dr Johnsie Cancel on cell at [phone] was told it was wrong number and not to call this number again for the patient. Called other number listed [phone] and it is not a working number. I also mailed a letter for patient to call to schedule CT and have not heard back from patient. Will staff message Dr Johnsie Cancel to make him aware unable to reach patient.

## 2020-04-05 ENCOUNTER — Ambulatory Visit: Payer: BC Managed Care – PPO | Attending: Internal Medicine

## 2020-04-05 DIAGNOSIS — Z23 Encounter for immunization: Secondary | ICD-10-CM

## 2020-04-05 NOTE — Progress Notes (Signed)
   Covid-19 Vaccination Clinic  Name:  Erik Mitchell    MRN: [id number] DOB: [birth year]  04/05/2020  Erik Mitchell was observed post Covid-19 immunization for 15 minutes without incident. He was provided with Vaccine Information Sheet and instruction to access the V-Safe system.   Erik Mitchell was instructed to call 911 with any severe reactions post vaccine: Marland Kitchen Difficulty breathing  . Swelling of face and throat  . A fast heartbeat  . A bad rash all over body  . Dizziness and weakness   Immunizations Administered    Name Date Dose VIS Date Route   Pfizer COVID-19 Vaccine 04/05/2020  8:13 AM 0.3 mL 02/11/2019 Intramuscular   Manufacturer: [place]   Lot: [id number]   [place]: [id number]

## 2020-04-22 ENCOUNTER — Other Ambulatory Visit: Payer: Self-pay | Admitting: *Deleted

## 2020-04-28 ENCOUNTER — Ambulatory Visit: Payer: BC Managed Care – PPO | Attending: Internal Medicine

## 2020-04-28 DIAGNOSIS — Z23 Encounter for immunization: Secondary | ICD-10-CM

## 2020-04-28 NOTE — Progress Notes (Signed)
   Covid-19 Vaccination Clinic  Name:  Erik Mitchell    MRN: [id number] DOB: 11/22/1961  04/28/2020  Erik Mitchell was observed post Covid-19 immunization for 15 minutes without incident. He was provided with Vaccine Information Sheet and instruction to access the V-Safe system.   Erik Mitchell was instructed to call 911 with any severe reactions post vaccine: Marland Kitchen Difficulty breathing  . Swelling of face and throat  . A fast heartbeat  . A bad rash all over body  . Dizziness and weakness   Immunizations Administered    Name Date Dose VIS Date Route   Pfizer COVID-19 Vaccine 04/28/2020  8:16 AM 0.3 mL 02/11/2019 Intramuscular   Manufacturer: [place]   Lot: [id number]   [place]: [id number]

## 2020-08-02 ENCOUNTER — Encounter: Payer: Self-pay | Admitting: Family Medicine

## 2020-08-02 NOTE — Telephone Encounter (Signed)
Pt already has 30' appt on 08/05/20 with Dr Damita Dunnings.

## 2020-08-02 NOTE — Telephone Encounter (Signed)
[facility] - Client TELEPHONE ADVICE RECORD [facility] Patient Name: Erik Mitchell Gender: Male DOB: April 01, 1961 Age: 58 Y 29 M 11 D Return Phone Number: [phone] (Primary), [phone] (Secondary) Address[place] [address] Client [place] [facility] - Client Client Site [place] - Day Physician Renford Dills - MD Contact Type Call Who Is Calling Patient / Member / Family / Caregiver Call Type Triage / Clinical Relationship To Patient Self Return Phone Number [phone] (Primary) Chief Complaint Numbness Reason for Call Symptomatic / Request for [place] states he has numbness in both hands.- since friday Translation No Nurse Assessment Nurse: Susy Manor, RN, Megan Date/Time (Eastern Time): 08/02/2020 2:41:54 PM Confirm and document reason for call. If symptomatic, describe symptoms. ---Caller states he is having numbness in both hands. This started Friday. The right hand is worse than the left. Has the patient had close contact with a person known or suspected to have the novel coronavirus illness OR traveled / lives in area with major community spread (including international travel) in the last 14 days from the onset of symptoms? * If Asymptomatic, screen for exposure and travel within the last 14 days. ---No Does the patient have any new or worsening symptoms? ---Yes Will a triage be completed? ---Yes Related visit to physician within the last 2 weeks? ---No Does the PT have any chronic conditions? (i.e. diabetes, asthma, this includes High risk factors for pregnancy, etc.) ---No Is this a behavioral health or substance abuse call? ---No Guidelines Guideline Title Affirmed Question Affirmed Notes Nurse Date/Time (Eastern Time) Neurologic Deficit [1] Numbness or tingling on both sides of body AND [2] is a new symptom present > 24 hours Susy Manor,  RN, Megan 08/02/2020 2:43:00 PM Disp. Time Eilene Ghazi Time) Disposition Final User 08/02/2020 2:45:11 PM SEE PCP WITHIN 3 DAYS Yes Susy Manor, RN, Megan PLEASE NOTE: All timestamps contained within this report are represented as [place] Standard Time. CONFIDENTIALTY NOTICE: This fax transmission is intended only for the addressee. It contains information that is legally privileged, confidential or otherwise protected from use or disclosure. If you are not the intended recipient, you are strictly prohibited from reviewing, disclosing, copying using or disseminating any of this information or taking any action in reliance on or regarding this information. If you have received this fax in error, please notify us immediately by telephone so that we can arrange for its return to [place]. Phone: [phone], Toll-Free: [phone], Fax: [phone] Page: 2 of 2 Call Id: [id number] [place] Disagree/Comply Comply Caller Understands Yes PreDisposition Call Doctor Care Advice Given Per Guideline SEE PCP WITHIN 3 DAYS: CALL BACK IF: * You become worse. CARE ADVICE given per Neurologic Deficit (Adult) guideline. Referrals REFERRED TO PCP OFFICE

## 2020-08-05 ENCOUNTER — Other Ambulatory Visit: Payer: Self-pay

## 2020-08-05 ENCOUNTER — Ambulatory Visit: Payer: BC Managed Care – PPO | Admitting: Family Medicine

## 2020-08-05 ENCOUNTER — Encounter: Payer: Self-pay | Admitting: Family Medicine

## 2020-08-05 ENCOUNTER — Telehealth: Payer: Self-pay | Admitting: Cardiovascular Disease

## 2020-08-05 VITALS — BP 118/82 | HR 65 | Temp 97.7°F | Ht 68.0 in | Wt 235.1 lb

## 2020-08-05 DIAGNOSIS — R202 Paresthesia of skin: Secondary | ICD-10-CM

## 2020-08-05 DIAGNOSIS — G5600 Carpal tunnel syndrome, unspecified upper limb: Secondary | ICD-10-CM

## 2020-08-05 DIAGNOSIS — I77819 Aortic ectasia, unspecified site: Secondary | ICD-10-CM | POA: Diagnosis not present

## 2020-08-05 LAB — VITAMIN B12: Vitamin B-12: 222 pg/mL (ref 211–911)

## 2020-08-05 NOTE — Telephone Encounter (Signed)
Fine with me

## 2020-08-05 NOTE — Telephone Encounter (Signed)
New Message  Pt is wanting to switch providers from Dr Johnsie Cancel to Dr Debara Pickett.   Please Advise

## 2020-08-05 NOTE — Progress Notes (Signed)
This visit occurred during the SARS-CoV-2 public health emergency.  Safety protocols were in place, including screening questions prior to the visit, additional usage of staff PPE, and extensive cleaning of exam room while observing appropriate contact time as indicated for disinfecting solutions.  R>L hand numbness.  Noted more in the last 1-2 weeks but going on before that, up to 1 year episodically.  No sx in the feet.  No FCANVD.  No radicular neck pain.  "tingly, pins and needles, numb."  Prev noted in the AMs but now more in the day.  R handed.  Frequently pneumatic/power tool use.    He wanted to see Dr. Debara Pickett with cardiology.  D/w pt.  Prev echo with mild dilatation of the ascending aorta.  Has family who has seen Dr. Debara Pickett.    Meds, vitals, and allergies reviewed.   ROS: Per HPI unless specifically indicated in ROS section   GEN: nad, alert and oriented HEENT: ncat NECK: supple w/o LA, normal range of motion. Normal grip bilaterally.  He has intact vibration sensation but slight decreased monofilament testing on the right greater than left hand.  Tinel positive right wrist on the flexor side. Normal sensation on the proximal upper extremities.

## 2020-08-05 NOTE — Telephone Encounter (Signed)
That's fine

## 2020-08-05 NOTE — Patient Instructions (Addendum)
Go to the lab on the way out.   If you have mychart we'll likely use that to update you.    We'll call about seeing Dr. Amedeo Plenty and Dr. Debara Pickett.   Start using a wrist splint at night.   Take care.  Glad to see you.

## 2020-08-08 DIAGNOSIS — G5600 Carpal tunnel syndrome, unspecified upper limb: Secondary | ICD-10-CM | POA: Insufficient documentation

## 2020-08-08 DIAGNOSIS — I77819 Aortic ectasia, unspecified site: Secondary | ICD-10-CM | POA: Insufficient documentation

## 2020-08-08 NOTE — Assessment & Plan Note (Signed)
Likely, refer to Dr. Amedeo Plenty.  Use the splint at night.  Check B12 level though I expect this to be normal.

## 2020-08-08 NOTE — Assessment & Plan Note (Signed)
Refer to Dr. Debara Pickett.  Discussed.

## 2020-08-20 ENCOUNTER — Telehealth: Payer: Self-pay | Admitting: *Deleted

## 2020-08-20 NOTE — Telephone Encounter (Signed)
Patient's wife left a voicemail stating that her husband has had hiccups for over 24 hours. Patient's wife stated nothing he has tried has helped Patient's wife requested that her husband be called back with suggestions.. Called patient back and was advised that he has had hiccups for over 24 hours. Patient stated that they will start and last for about 3-4 hours, will go away for about 30 minutes to an hour and then come back. .Patient was advised a remedy that has helped people in the past is to hold your nose, try to hold your breath and drink some water. Patient stated at the present time he does not have hiccups but will try this the next time he has them. Advised patient that a message will go back to Dr. Damita Dunnings for any other suggestions.

## 2020-08-20 NOTE — Telephone Encounter (Signed)
Patient advised.

## 2020-08-20 NOTE — Telephone Encounter (Signed)
I would try to avoid meds for this at first and use the given advice.  Most cases get better in 48 hours.  If he keeps having trouble >2 days, then we need to check him at the clinic.  Thanks.

## 2020-10-04 ENCOUNTER — Encounter: Payer: Self-pay | Admitting: Internal Medicine

## 2020-10-04 ENCOUNTER — Ambulatory Visit: Payer: BC Managed Care – PPO | Admitting: Internal Medicine

## 2020-10-04 ENCOUNTER — Other Ambulatory Visit: Payer: Self-pay

## 2020-10-04 VITALS — BP 131/75 | HR 59 | Ht 68.5 in | Wt 234.8 lb

## 2020-10-04 DIAGNOSIS — R001 Bradycardia, unspecified: Secondary | ICD-10-CM | POA: Diagnosis not present

## 2020-10-04 DIAGNOSIS — Z01812 Encounter for preprocedural laboratory examination: Secondary | ICD-10-CM

## 2020-10-04 DIAGNOSIS — E782 Mixed hyperlipidemia: Secondary | ICD-10-CM

## 2020-10-04 DIAGNOSIS — Z8249 Family history of ischemic heart disease and other diseases of the circulatory system: Secondary | ICD-10-CM

## 2020-10-04 DIAGNOSIS — R072 Precordial pain: Secondary | ICD-10-CM

## 2020-10-04 DIAGNOSIS — R5383 Other fatigue: Secondary | ICD-10-CM | POA: Diagnosis not present

## 2020-10-04 NOTE — Patient Instructions (Addendum)
Medication Instructions:  Your physician recommends that you continue on your current medications as directed. Please refer to the Current Medication list given to you today.  *If you need a refill on your cardiac medications before your next appointment, please call your pharmacy*   Lab Work: BMET to be completed at least 2-3 days prior to CT test  If you have labs (blood work) drawn today and your tests are completely normal, you will receive your results only by: Marland Kitchen MyChart Message (if you have MyChart) OR . A paper copy in the mail If you have any lab test that is abnormal or we need to change your treatment, we will call you to review the results.   Testing/Procedures: Coronary CT Test @ [facility]   Follow-Up: At [facility], you and your health needs are our priority.  As part of our continuing mission to provide you with exceptional heart care, we have created designated Provider Care Teams.  These Care Teams include your primary Cardiologist (physician) and Advanced Practice Providers (APPs -  Physician Assistants and Nurse Practitioners) who all work together to provide you with the care you need, when you need it.  We recommend signing up for the patient portal called "MyChart".  Sign up information is provided on this After Visit Summary.  MyChart is used to connect with patients for Virtual Visits (Telemedicine).  Patients are able to view lab/test results, encounter notes, upcoming appointments, etc.  Non-urgent messages can be sent to your provider as well.   To learn more about what you can do with MyChart, go to [email or web address].    Your next appointment:   4-6 week(s) - after CT test  The format for your next appointment:   In Person  Provider:   You may see Dr. Debara Pickett or one of the following Advanced Practice Providers on your designated Care Team:    Almyra Deforest, PA-C  Fabian Sharp, [place] or   Roby Lofts, [place]    Other Instructions Your cardiac  CT will be scheduled at one of the below locations:   [facility] [address] [place], [place] [address] [phone]  [place] [address] [place], [place] [address] [phone]  If scheduled at [facility], please arrive at the [facility] main entrance of [facility] 30 minutes prior to test start time. Proceed to the [facility] Radiology Department (first floor) to check-in and test prep.  If scheduled at [facility], please arrive 15 mins early for check-in and test prep.  Please follow these instructions carefully (unless otherwise directed):  Hold all erectile dysfunction medications at least 3 days (72 hrs) prior to test.  On the Night Before the Test: . Be sure to Drink plenty of water. . Do not consume any caffeinated/decaffeinated beverages or chocolate 12 hours prior to your test. . Do not take any antihistamines 12 hours prior to your test.  On the Day of the Test: . Drink plenty of water. Do not drink any water within one hour of the test. . Do not eat any food 4 hours prior to the test. . You may take your regular medications prior to the test.       After the Test: . Drink plenty of water. . After receiving IV contrast, you may experience a mild flushed feeling. This is normal. . On occasion, you may experience a mild rash up to 24 hours  after the test. This is not dangerous. If this occurs, you can take Benadryl 25 mg and increase your fluid intake. . If you experience trouble breathing, this can be serious. If it is severe call 911 IMMEDIATELY. If it is mild, please call our office. . If you take any of these medications: Glipizide/Metformin, Avandament, Glucavance, please do not take 48 hours after completing test unless otherwise instructed.   Once we have confirmed authorization from your insurance company, we will call you to set up a date  and time for your test. Based on how quickly your insurance processes prior authorizations requests, please allow up to 4 weeks to be contacted for scheduling your Cardiac CT appointment. Be advised that routine Cardiac CT appointments could be scheduled as many as 8 weeks after your provider has ordered it.  For non-scheduling related questions, please contact the cardiac imaging nurse navigator should you have any questions/concerns: Marchia Bond, Cardiac Imaging Nurse Navigator Burley Saver, Interim Cardiac Imaging Nurse [place] [facility] Direct Office Dial: [phone]   For scheduling needs, including cancellations and rescheduling, please call Vivien Rota at [phone], option 3.

## 2020-10-05 ENCOUNTER — Encounter: Payer: Self-pay | Admitting: Internal Medicine

## 2020-10-05 NOTE — Progress Notes (Signed)
OFFICE NOTE  Chief Complaint:  Establish cardiologist  Primary Care Physician: Tonia Ghent, MD  HPI:  Erik Mitchell is a 59 y.o. male with a past medial history significant for hypertension, dyslipidemia and bradycardia.  [name] is a pleasant 59 year old male previously seen by Dr. Johnsie Cancel for bradycardia.  He has reported some exertional dyspnea and infrequent chest discomfort.  He underwent exercise treadmill stress testing in 1996 and a Myoview stress test in 2003 which showed normal LVEF of 67%.  He reports being able to augment heart rate with exercise.  He had a repeat treadmill stress test in February 2020 which was negative as well.  Heart rate increased to 146 with max exercise.  He does report some nocturnal bradycardia.  His wife is a patient of mine and she wanted him to see me for a second opinion.  More recently an echo in February 2020 revealed an EF of 60 to 65% however the aortic root was mildly dilated at 4 cm.  A CTA was recommended in February 2021, but has not been performed.  Since he last saw Dr. Johnsie Cancel he has had some shortness of breath with exertion and gets some infrequent chest discomfort.  He describes it as sharp or stabbing in the left anterior chest wall and lasts only for a few seconds.  Sometimes with exertion but at other times can come at rest.  Overall seems somewhat atypical.  Heart rate today was 59.  EKG demonstrated sinus bradycardia without ischemic changes.  Normal axes and normal intervals.  PMHx:  Past Medical History:  Diagnosis Date  . Allergic rhinitis 02/16/2003  . Allergy   . Anxiety   . GERD (gastroesophageal reflux disease) 12/18/2002  . H/O cold sores    valtrex suppression  . History of ETT 11/96   wnl, cardiology consult0 [facility] 01/97  . Hyperlipemia 08/19/1995  . Hypertension 08/19/95  . Shingles     Past Surgical History:  Procedure Laterality Date  . TENDON REPAIR     R 4th finger  . TONSILLECTOMY      FAMHx:    Family History  Problem Relation Age of Onset  . Diabetes Mother   . Heart disease Mother   . Cancer Mother   . Colon cancer Mother        presumed  . Heart disease Father        MI CABG x 3 Pacer  . Diabetes Brother   . Heart disease Brother        MI x 2 Stents  . Prostate cancer Neg Hx   . Rectal cancer Neg Hx   . Stomach cancer Neg Hx     SOCHx:   reports that he has never smoked. He has never used smokeless tobacco. He reports current alcohol use. He reports that he does not use drugs.  ALLERGIES:  Allergies  Allergen Reactions  . Bee Venom     Swelling, short of breath  . Meloxicam Other (See Comments)    GI upset  . Naproxen Other (See Comments)    GI upset    ROS: Pertinent items noted in HPI and remainder of comprehensive ROS otherwise negative.  HOME MEDS: Current Outpatient Medications on File Prior to Visit  Medication Sig Dispense Refill  . aspirin 81 MG tablet Take 81 mg by mouth daily.      Marland Kitchen atorvastatin (LIPITOR) 10 MG tablet Take 1 tablet (10 mg total) by mouth at bedtime. 90 tablet 3  .  esomeprazole (NEXIUM) 40 MG capsule TAKE 1 CAPSULE BY MOUTH EVERY DAY 90 capsule 3  . valACYclovir (VALTREX) 500 MG tablet Take 1 tablet (500 mg total) by mouth daily. 90 tablet 3   No current facility-administered medications on file prior to visit.    LABS/IMAGING: No results found for this or any previous visit (from the past 48 hour(s)). No results found.  LIPID PANEL:    Component Value Date/Time   CHOL 160 01/12/2020 1526   TRIG 84.0 01/12/2020 1526   HDL 34.80 (L) 01/12/2020 1526   CHOLHDL 5 01/12/2020 1526   VLDL 16.8 01/12/2020 1526   LDLCALC 108 (H) 01/12/2020 1526   LDLCALC 157 (H) 10/04/2018 1550   LDLDIRECT 84.0 01/07/2019 1652     WEIGHTS: Wt Readings from Last 3 Encounters:  10/04/20 234 lb 12.8 oz (106.5 kg)  08/05/20 235 lb 2 oz (106.7 kg)  01/19/20 230 lb 8 oz (104.6 kg)    VITALS: BP 131/75   Pulse (!) 59   Ht 5' 8.5" (1.74  m)   Wt 234 lb 12.8 oz (106.5 kg)   SpO2 99%   BMI 35.18 kg/m   EXAM: General appearance: alert and no distress Neck: no carotid bruit, no JVD and thyroid not enlarged, symmetric, no tenderness/mass/nodules Lungs: clear to auscultation bilaterally Heart: regular rate and rhythm Abdomen: soft, non-tender; bowel sounds normal; no masses,  no organomegaly Extremities: extremities normal, atraumatic, no cyanosis or edema Pulses: 2+ and symmetric Skin: Skin color, texture, turgor normal. No rashes or lesions Neurologic: Grossly normal Psych: Pleasant  EKG: Sinus bradycardia at 59- personally reviewed  ASSESSMENT: 1. Bradycardia with normal heart rate augmentation during exercise 2. Family history of coronary disease in father and brother 40. Hypertension 4. Dyslipidemia 5. Mildly dilated aortic root at 4.0 cm (01/2019)  PLAN: 1.   Mr. Bruschi has had bradycardia but has demonstrated chronotropic competence by exercise stress testing.  He has a family history of heart disease in his father and brother.  More recently he has been short of breath with some stabbing left chest pain.  He is overdue for reassessment of his aortic root which was supposed to be a CT angiogram.  I think would be beneficial to get a CT coronary angiogram which allow [place] to measure and evaluate his aortic root as well as to evaluate his coronaries and hopefully exclude any significant coronary artery disease.  I suspect vagal tone is a cause for his bradycardia more likely than ischemia.  He is not on any AV nodal blocking agents and these should be avoided.   Also of note he is on low-dose atorvastatin 10 mg daily.  LDL was 108 in January 2021.  If there is some significant coronary disease, I would likely expect to increase this to a high potency dose with a target LDL less than 70. Plan follow-up with me afterwards to discuss the findings.   Pixie Casino, MD, [facility], [place]  Director of the Advanced Lipid Disorders &  Cardiovascular Risk Reduction Clinic Diplomate of the American Board of Clinical Lipidology Attending Cardiologist  Direct Dial: [phone]  Fax: [phone]  Website:  [email or web address] 10/05/2020, 8:31 AM

## 2020-11-03 ENCOUNTER — Telehealth: Payer: Self-pay | Admitting: Internal Medicine

## 2020-11-03 NOTE — Telephone Encounter (Signed)
Left message for patient to call and schedule 6 week follow up appointment with Dr. Debara Pickett following the Cardiac CT scheduled 11/10/20.

## 2020-11-04 NOTE — Telephone Encounter (Signed)
Left message for patient to call and schedule 6 week follow up appointment with D.r HIlty after Coronary CT on 11/10/20

## 2020-11-08 NOTE — Telephone Encounter (Signed)
Left message for patient to call and schedule follow up with Dr. Debara Pickett 4-6 weeks after his Cardiac CT scheduled 11/15/20

## 2020-11-09 NOTE — Telephone Encounter (Signed)
Left message for patient to call and scheduled follow up appointment with Dr. Debara Pickett after the 11/15/20 scheduled Cardiac CT

## 2020-11-10 ENCOUNTER — Ambulatory Visit (HOSPITAL_COMMUNITY): Payer: BC Managed Care – PPO

## 2020-11-10 ENCOUNTER — Telehealth (HOSPITAL_COMMUNITY): Payer: Self-pay | Admitting: Emergency Medicine

## 2020-11-10 NOTE — Telephone Encounter (Signed)
Reaching out to patient to offer assistance regarding upcoming cardiac imaging study; pt verbalizes understanding of appt date/time, parking situation and where to check in, pre-test NPO status and medications ordered, and verified current allergies; name and call back number provided for further questions should they arise Jamine Highfill RN Navigator Cardiac Imaging [facility] [phone] office [phone] cell 

## 2020-11-10 NOTE — Telephone Encounter (Signed)
Attempted to call patient regarding upcoming cardiac CT appointment. °Left message on voicemail with name and callback number °Deval Mroczka RN Navigator Cardiac Imaging °[facility] °[phone] Office °[phone] Cell ° °

## 2020-11-15 ENCOUNTER — Telehealth: Payer: Self-pay | Admitting: *Deleted

## 2020-11-15 ENCOUNTER — Other Ambulatory Visit: Payer: Self-pay

## 2020-11-15 ENCOUNTER — Ambulatory Visit (HOSPITAL_COMMUNITY)
Admission: RE | Admit: 2020-11-15 | Discharge: 2020-11-15 | Disposition: A | Discharge status: Home / Self Care | Payer: BC Managed Care – PPO | Source: Ambulatory Visit | Attending: Internal Medicine | Admitting: Internal Medicine

## 2020-11-15 ENCOUNTER — Encounter: Payer: BC Managed Care – PPO | Admitting: *Deleted

## 2020-11-15 DIAGNOSIS — R001 Bradycardia, unspecified: Secondary | ICD-10-CM | POA: Insufficient documentation

## 2020-11-15 DIAGNOSIS — R5383 Other fatigue: Secondary | ICD-10-CM | POA: Insufficient documentation

## 2020-11-15 DIAGNOSIS — Z006 Encounter for examination for normal comparison and control in clinical research program: Secondary | ICD-10-CM

## 2020-11-15 DIAGNOSIS — R072 Precordial pain: Secondary | ICD-10-CM | POA: Insufficient documentation

## 2020-11-15 DIAGNOSIS — Z8249 Family history of ischemic heart disease and other diseases of the circulatory system: Secondary | ICD-10-CM | POA: Diagnosis present

## 2020-11-15 DIAGNOSIS — I700 Atherosclerosis of aorta: Secondary | ICD-10-CM | POA: Diagnosis not present

## 2020-11-15 DIAGNOSIS — I2510 Atherosclerotic heart disease of native coronary artery without angina pectoris: Secondary | ICD-10-CM | POA: Diagnosis not present

## 2020-11-15 IMAGING — CT CT HEART MORP W/ CTA COR W/ SCORE W/ CA W/CM &/OR W/O CM
1 of 11 series · 8 of 20 positions shown, 10 images · IV contrast (APPLIED)
Comparison: None.
COMPARISON: None.

Addendum:
EXAM:
OVER-READ INTERPRETATION  CT CHEST

The following report is an over-read performed by radiologist Dr.
over-read does not include interpretation of cardiac or coronary
anatomy or pathology. The coronary CTA interpretation by the
cardiologist is attached.
HISTORY: 59 yo male with chest pain, nonspecific chest pain, fatigue, dilated
aortic root
Cardiac/Coronary CTA
TECHNIQUE: The patient was scanned on a Siemens Force scanner.
PROTOCOL: A 120 kV prospective and then repeat retrospective scan was
triggered in the descending thoracic aorta at 111 HU's. Axial
non-contrast 3 mm slices were carried out through the heart. The
data set was analyzed on a dedicated work station and scored using
the Agatson method. Gantry rotation speed was 250 msecs and
collimation was .6 mm. Beta blockade and 0.8 mg of sl NTG was given.
The 3D data set was reconstructed in 5% intervals of the 67-82 % of
the R-R cycle. Diastolic phases were analyzed on a dedicated work
station using MPR, MIP and VRT modes. The patient received 160mL
OMNIPAQUE IOHEXOL 350 MG/ML SOLN of contrast.

[Series 19: multiphase · axial · 0.39mm/px · z∈[+1212,+1322]mm · 8 of 2360 slices shown, 10 images]
[im 263/2360  vessel]
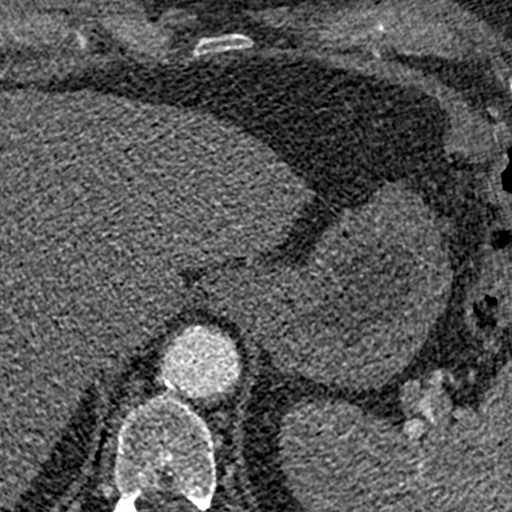
[im 263/2360  lung]
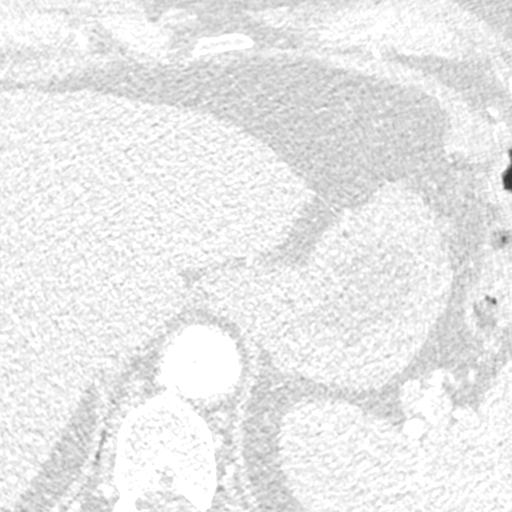
[im 525/2360  vessel]
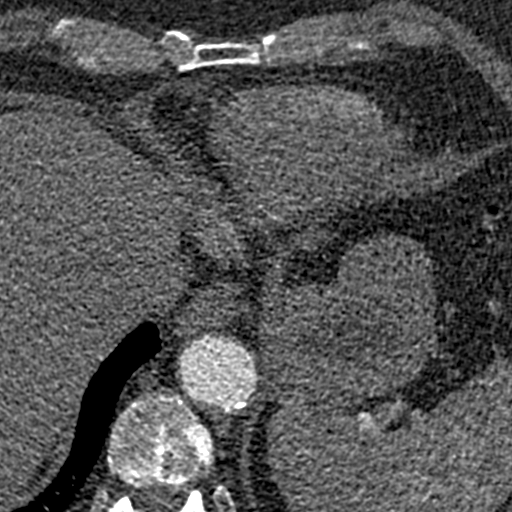
[im 787/2360  vessel]
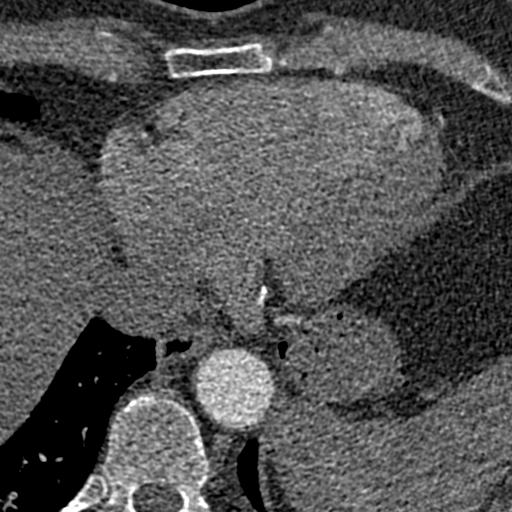
[im 1049/2360  vessel]
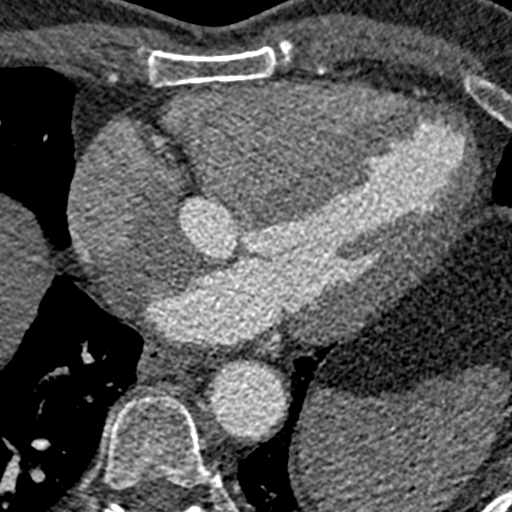
[im 1311/2360  vessel]
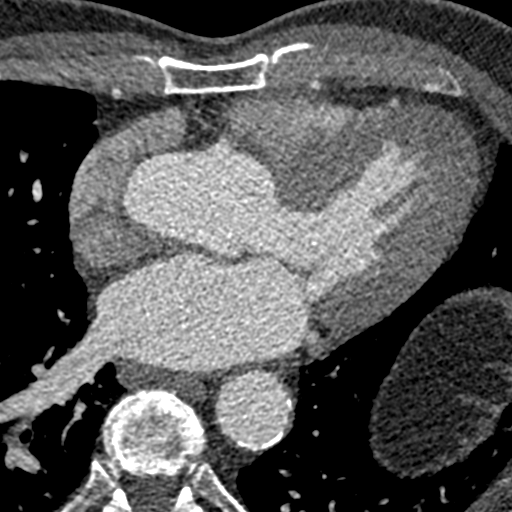
[im 1311/2360  lung]
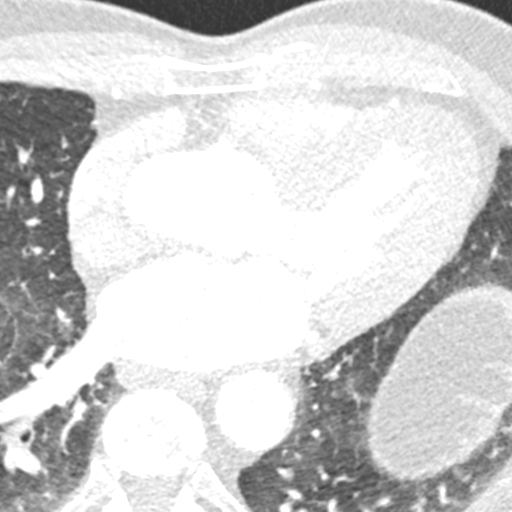
[im 1573/2360  vessel]
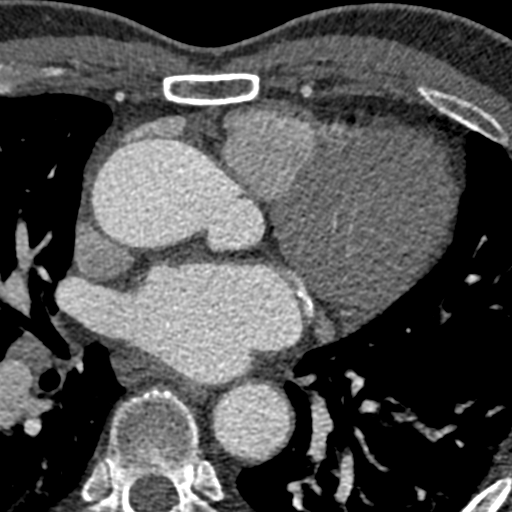
[im 1835/2360  vessel]
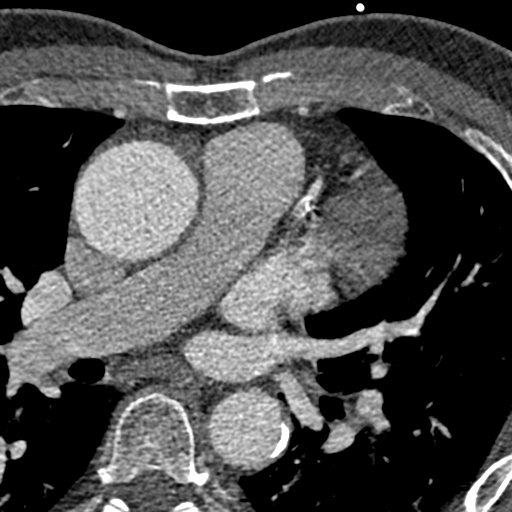
[im 2097/2360  vessel]
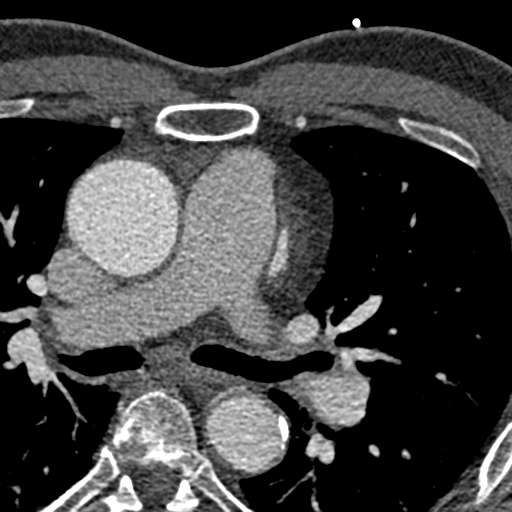

[8 of 20 positions shown; findings below may reference images not displayed]

FINDINGS: Vascular: Ascending aortic dilatation, including at 4.3 cm in the
mid ascending segment on [DATE]. When correlated with coronal
reformats, 4.4 cm in the mid ascending segment on coronal image
42/11. Aortic atherosclerosis. Tortuous thoracic aorta. No central
pulmonary embolism, on this non-dedicated study.

Mediastinum/Nodes: No imaged thoracic adenopathy.

Lungs/Pleura: No pleural fluid.  Clear imaged lungs.

Upper Abdomen: Normal imaged portions of the liver, spleen, stomach,
adrenal glands.

Musculoskeletal: No acute osseous abnormality.
IMPRESSION: 1. No acute findings in the imaged extracardiac chest.
2. Aortic Atherosclerosis (MGWKE-D94.4).
3. Ascending aortic dilatation, on the order of 4.3-4.4 cm.
Recommend annual imaging followup by CTA or MRA. This recommendation
follows 5979 ACCF/AHA/AATS/ACR/ASA/SCA/ROBERTH/PACHU/BG/QUIRIJN Guidelines
for the Diagnosis and Management of Patients with Thoracic Aortic
Disease. Circulation. 5979; 121: E266-e369. Aortic aneurysm NOS
(MGWKE-3AZ.0)

These results will be called to the ordering clinician or
representative by the Radiologist Assistant, and communication
documented in the PACS or [REDACTED].
FINDINGS: Quality: Poor, respiratory and motion artifact, HR 75 (initial gated
prospective, then repeat restrospective scan performed)

Coronary calcium score: The patient's coronary artery calcium score
is 882, which places the patient in the 97th percentile.

Coronary arteries: Normal coronary origins.  Left dominance.

Right Coronary Artery: Suboptimally visualized. Apperars small,
non-dominant. Minimal mixed proximal 1-24% stenosis ([id number]).

Left Main Coronary Artery: Short vessel. Bifurcates into the LAD and
LCx arteries.

Left Anterior Descending Coronary Artery: Moderate to severe
proximal LAD mixed stenosis ([id number]). Mid-vessel diagonal branch
with mild 25-49% proximal mixed stenosis (UR4MR4D4).

Left Circumflex Artery: Dominant. Mild proximal 25-49% mixed
stenoses (UR4MR4D4). Calcifcation of the L-PDA with mild to moderate
stenosis (CADRADS 2).

Aorta: Dilated aorta to 43 mm at the mid ascending aorta (level of
the PA bifurcation) measured double oblique. Aortic atherosclerosis.
No dissection.

Aortic Valve: Trileaflet.  Mild calcifications.

Other findings:

Normal pulmonary vein drainage into the left atrium.

Normal left atrial appendage without a thrombus.

Dilated main pulmonary artery to 33 mm, suggestive of pulmonary
hypertension.
IMPRESSION: 1. Moderate to severe mixed CAD primarily of the proximal LAD,
CADRADS = 3. Significant artifacts as described will likely preclude
CT FFR analysis.

2. Coronary calcium score of 882. This was 97th percentile for age
and sex matched control.

3. Normal coronary origin with left dominance.

4. Dilated main pulmonary artery to 33 mm, suggestive of pulmonary
hypertension.

5. Dilated aorta to 43 mm at the level of the main PA bifurcation.

6. Aortic atherosclerosis

7. Definitive cardiac catheterization is recommended.

*** End of Addendum ***
EXAM:
OVER-READ INTERPRETATION  CT CHEST

The following report is an over-read performed by radiologist Dr.
over-read does not include interpretation of cardiac or coronary
anatomy or pathology. The coronary CTA interpretation by the
cardiologist is attached.
FINDINGS: Vascular: Ascending aortic dilatation, including at 4.3 cm in the
mid ascending segment on [DATE]. When correlated with coronal
reformats, 4.4 cm in the mid ascending segment on coronal image
42/11. Aortic atherosclerosis. Tortuous thoracic aorta. No central
pulmonary embolism, on this non-dedicated study.

Mediastinum/Nodes: No imaged thoracic adenopathy.

Lungs/Pleura: No pleural fluid.  Clear imaged lungs.

Upper Abdomen: Normal imaged portions of the liver, spleen, stomach,
adrenal glands.

Musculoskeletal: No acute osseous abnormality.
IMPRESSION: 1. No acute findings in the imaged extracardiac chest.
2. Aortic Atherosclerosis (MGWKE-D94.4).
3. Ascending aortic dilatation, on the order of 4.3-4.4 cm.
Recommend annual imaging followup by CTA or MRA. This recommendation
follows 5979 ACCF/AHA/AATS/ACR/ASA/SCA/ROBERTH/PACHU/BG/QUIRIJN Guidelines
for the Diagnosis and Management of Patients with Thoracic Aortic
Disease. Circulation. 5979; 121: E266-e369. Aortic aneurysm NOS
(MGWKE-3AZ.0)

These results will be called to the ordering clinician or
representative by the Radiologist Assistant, and communication
documented in the PACS or [REDACTED].

## 2020-11-15 MED ORDER — NITROGLYCERIN 0.4 MG SL SUBL
0.8000 mg | SUBLINGUAL_TABLET | Freq: Once | SUBLINGUAL | Status: AC
  Administered 2020-11-15: 0.8 mg via SUBLINGUAL

## 2020-11-15 MED ORDER — NITROGLYCERIN 0.4 MG SL SUBL
SUBLINGUAL_TABLET | SUBLINGUAL | Status: AC
  Filled 2020-11-15: qty 2

## 2020-11-15 MED ORDER — IOHEXOL 350 MG/ML SOLN
160.0000 mL | Freq: Once | INTRAVENOUS | Status: AC | PRN
  Administered 2020-11-15: 160 mL via INTRAVENOUS

## 2020-11-15 NOTE — Research (Signed)
Subject Name: Erik Mitchell  Subject met inclusion and exclusion criteria.  The informed consent form, study requirements and expectations were reviewed with the subject and questions and concerns were addressed prior to the signing of the consent form.  The subject verbalized understanding of the trial requirements.  The subject agreed to participate in the IDENTIFY trial and signed the informed consent at 1347 on 11/15/20  The informed consent was obtained prior to performance of any protocol-specific procedures for the subject.  A copy of the signed informed consent was given to the subject and a copy was placed in the subject's medical record.   [name]

## 2020-11-15 NOTE — Telephone Encounter (Signed)
Received call from [place] to report results for coronary CTA.  Over-read CT chest results in chart.  IMPRESSION: 1. No acute findings in the imaged extracardiac chest. 2. Aortic Atherosclerosis (ICD10-I70.0). 3. Ascending aortic dilatation, on the order of 4.3-4.4 cm. Recommend annual imaging followup by CTA or MRA. This recommendation follows 2010 ACCF/AHA/AATS/ACR/ASA/SCA/SCAI/SIR/STS/SVM Guidelines for the Diagnosis and Management of Patients with Thoracic Aortic Disease. Circulation. 2010; 121: B559-R416. Aortic aneurysm NOS (ICD10-I71.9)  These results will be called to the ordering clinician or representative by the Radiologist Assistant, and communication documented in the PACS or [employer].   Will make MD aware.

## 2020-11-15 NOTE — Telephone Encounter (Signed)
Thanks .Marland Kitchen I read the coronary portion of the CT - I'm recommending a cardiac cath.  Dr Lemmie Evens

## 2020-11-17 ENCOUNTER — Telehealth: Payer: Self-pay | Admitting: Internal Medicine

## 2020-11-17 DIAGNOSIS — Z01812 Encounter for preprocedural laboratory examination: Secondary | ICD-10-CM

## 2020-11-17 DIAGNOSIS — R0602 Shortness of breath: Secondary | ICD-10-CM

## 2020-11-17 DIAGNOSIS — I2510 Atherosclerotic heart disease of native coronary artery without angina pectoris: Secondary | ICD-10-CM

## 2020-11-17 DIAGNOSIS — R072 Precordial pain: Secondary | ICD-10-CM

## 2020-11-17 DIAGNOSIS — I2584 Coronary atherosclerosis due to calcified coronary lesion: Secondary | ICD-10-CM

## 2020-11-17 NOTE — Telephone Encounter (Signed)
Spoke with patient regarding Cardiac CT follow up appointment with Dr. Debara Pickett.  Patient states he had a phone call from the nurse with his test results and does not need to come in the see Dr. Debara Pickett.

## 2020-11-17 NOTE — Telephone Encounter (Signed)
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

## 2020-11-18 ENCOUNTER — Encounter: Payer: Self-pay | Admitting: Internal Medicine

## 2020-11-18 ENCOUNTER — Telehealth: Payer: Self-pay | Admitting: Internal Medicine

## 2020-11-18 ENCOUNTER — Other Ambulatory Visit: Payer: Self-pay | Admitting: Internal Medicine

## 2020-11-18 DIAGNOSIS — I2510 Atherosclerotic heart disease of native coronary artery without angina pectoris: Secondary | ICD-10-CM

## 2020-11-18 DIAGNOSIS — R072 Precordial pain: Secondary | ICD-10-CM

## 2020-11-18 DIAGNOSIS — R0602 Shortness of breath: Secondary | ICD-10-CM

## 2020-11-18 DIAGNOSIS — I77810 Thoracic aortic ectasia: Secondary | ICD-10-CM

## 2020-11-18 DIAGNOSIS — E785 Hyperlipidemia, unspecified: Secondary | ICD-10-CM

## 2020-11-18 DIAGNOSIS — Z8249 Family history of ischemic heart disease and other diseases of the circulatory system: Secondary | ICD-10-CM

## 2020-11-18 NOTE — Telephone Encounter (Signed)
Called and discussed cardiac CT findings with Erik Mitchell as well as recommendations for left heart cath. See telephone note for documentation and informed consent.   Dr Lemmie Evens

## 2020-11-18 NOTE — Telephone Encounter (Signed)
Scheduled left heart cath with Jetta @ [facility] cath lab for Tuesday Dec. 7, 2021 @ 10:30am (arrive at 8:30am) COVID screening scheduled for Saturday Dec. 4, 2021 @ 11:25am  Instructions sent via MyChart & also printed for patient (left at front desk)

## 2020-11-18 NOTE — H&P (Signed)
ADMISSION HISTORY & PHYSICAL  Patient Name: Erik Mitchell Date of Encounter: 11/18/2020 Primary Care Physician: Tonia Ghent, MD Cardiologist: Pixie Casino, MD  Chief Complaint   Chest pain  Patient Profile   59 yo male with chest pain  HPI   This is a 59 y.o. male with a past medical history significant for  hypertension, dyslipidemia and bradycardia.  Erik Mitchell is a pleasant 59 year old male previously seen by Dr. Johnsie Cancel for bradycardia.  He has reported some exertional dyspnea and infrequent chest discomfort.  He underwent exercise treadmill stress testing in 1996 and a Myoview stress test in 2003 which showed normal LVEF of 67%.  He reports being able to augment heart rate with exercise.  He had a repeat treadmill stress test in February 2020 which was negative as well.  Heart rate increased to 146 with max exercise.  He does report some nocturnal bradycardia.  His wife is a patient of mine and she wanted him to see me for a second opinion.  More recently an echo in February 2020 revealed an EF of 60 to 65% however the aortic root was mildly dilated at 4 cm.  A CTA was recommended in February 2021, but has not been performed.  Since he last saw Dr. Johnsie Cancel he has had some shortness of breath with exertion and gets some infrequent chest discomfort.  He describes it as sharp or stabbing in the left anterior chest wall and lasts only for a few seconds.  Sometimes with exertion but at other times can come at rest.  Overall seems somewhat atypical.  Heart rate today was 59.  EKG demonstrated sinus bradycardia without ischemic changes.  Normal axes and normal intervals.  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  PMHx   Past Medical History:  Diagnosis  Date  . Allergic rhinitis 02/16/2003  . Allergy   . Anxiety   . GERD (gastroesophageal reflux disease) 12/18/2002  . H/O cold sores    valtrex suppression  . History of ETT 11/96   wnl, cardiology consult[facility] 01/97  . Hyperlipemia 08/19/1995  . Hypertension 08/19/95  . Shingles     Past Surgical History:  Procedure Laterality Date  . TENDON REPAIR     R 4th finger  . TONSILLECTOMY      FAMHx   Family History  Problem Relation Age of Onset  . Diabetes Mother   . Heart disease Mother   . Cancer Mother   . Colon cancer Mother        presumed  . Heart disease Father        MI CABG x 3 Pacer  . Diabetes Brother   . Heart disease Brother        MI x 2 Stents  . Prostate cancer Neg Hx   . Rectal cancer Neg Hx   . Stomach cancer Neg Hx     SOCHx    reports that he has never smoked. He has never used smokeless tobacco. He reports current alcohol use. He reports that he does not use drugs.  Outpatient Medications   Current Outpatient Medications on File Prior to Visit  Medication Sig Dispense Refill  . Ascorbic Acid (VITAMIN C PO) Take 1 tablet by mouth at bedtime.     Marland Kitchen  aspirin 81 MG tablet Take 81 mg by mouth at bedtime.     Marland Kitchen atorvastatin (LIPITOR) 10 MG tablet Take 1 tablet (10 mg total) by mouth at bedtime. (Patient taking differently: Take 40 mg by mouth at bedtime. ) 90 tablet 3  . esomeprazole (NEXIUM) 40 MG capsule TAKE 1 CAPSULE BY MOUTH EVERY DAY (Patient taking differently: Take 40 mg by mouth at bedtime. ) 90 capsule 3  . valACYclovir (VALTREX) 500 MG tablet Take 1 tablet (500 mg total) by mouth daily. (Patient taking differently: Take 500 mg by mouth at bedtime. ) 90 tablet 3  . zinc gluconate 50 MG tablet Take 50 mg by mouth at bedtime.      No current facility-administered medications on file prior to visit.    Inpatient Medications    Scheduled Meds:   Continuous Infusions:   PRN Meds:    ALLERGIES   Allergies  Allergen Reactions    . Bee Venom     Swelling, short of breath  . Codeine Other (See Comments)    unknown  . Meloxicam Other (See Comments)    GI upset  . Naproxen Other (See Comments)    GI upset    ROS   Pertinent items noted in HPI and remainder of comprehensive ROS otherwise negative.  Vitals   There were no vitals filed for this visit. @IOBRIEF @ There were no vitals filed for this visit.  Physical Exam   See exam on 10/04/20  Labs   No results found for this or any previous visit (from the past 59 hour(s)).  ECG   N/A  Telemetry   N/A  Radiology   No results found.  Cardiac Studies   ADDENDUM REPORT: 11/15/2020 20:44  HISTORY: 59 yo male with chest pain, nonspecific chest pain, fatigue, dilated aortic root  EXAM: Cardiac/Coronary CTA  TECHNIQUE: The patient was scanned on a [employer].  PROTOCOL: A 120 kV prospective and then repeat retrospective scan was triggered in the descending thoracic aorta at 111 HU's. Axial non-contrast 3 mm slices were carried out through the heart. The data set was analyzed on a dedicated work station and scored using the [place]. Gantry rotation speed was 250 msecs and collimation was .6 mm. Beta blockade and 0.8 mg of sl NTG was given. The 3D data set was reconstructed in 5% intervals of the 67-82 % of the R-R cycle. Diastolic phases were analyzed on a dedicated work station using MPR, MIP and VRT modes. The patient received 146mL OMNIPAQUE IOHEXOL 350 MG/ML SOLN of contrast.  FINDINGS: Quality: Poor, respiratory and motion artifact, HR 75 (initial gated prospective, then repeat restrospective scan performed)  Coronary calcium score: The patient's coronary artery calcium score is 882, which places the patient in the 97th percentile.  Coronary arteries: Normal coronary origins.  Left dominance.  Right Coronary Artery: Suboptimally visualized. Apperars small, non-dominant. Minimal mixed proximal 1-24%  stenosis (CADRADS1).  Left Main Coronary Artery: Short vessel. Bifurcates into the LAD and LCx arteries.  Left Anterior Descending Coronary Artery: Moderate to severe proximal LAD mixed stenosis (CADRADS3). Mid-vessel diagonal branch with mild 25-49% proximal mixed stenosis (CADRADS2).  Left Circumflex Artery: Dominant. Mild proximal 25-49% mixed stenoses (CADRADS2). Calcifcation of the L-PDA with mild to moderate stenosis (CADRADS 2).  Aorta: Dilated aorta to 43 mm at the mid ascending aorta (level of the PA bifurcation) measured double oblique. Aortic atherosclerosis. No dissection.  Aortic Valve: Trileaflet.  Mild calcifications.  Other findings:  Normal pulmonary vein  drainage into the left atrium.  Normal left atrial appendage without a thrombus.  Dilated main pulmonary artery to 33 mm, suggestive of pulmonary hypertension.  IMPRESSION: 1. 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 Significant artifacts as described will likely preclude CT FFR analysis.  2. Coronary calcium score of 882. This was 97th percentile for age and sex matched control.  3. Normal coronary origin with left dominance.  4. Dilated main pulmonary artery to 33 mm, suggestive of pulmonary hypertension.  5. 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  6. Aortic atherosclerosis  7. Definitive cardiac catheterization is recommended.   Electronically Signed   By: Pixie Casino M.D.   On: 11/15/2020 20:44   Assessment   1. Abnormal coronary CT angiogram 2. Chest pain  Plan   1. Erik Mitchell had an abnormal CT coronary angiogram showing heavy calcification, especially of the proximal LAD. There was also artifact that confounded the study and would make an FFR evaluation inconclusive. Based on his continued symptoms as well as heavy calcium burden, would recommend a definitive left heart cath and possible PCI. I discussed  the risks, benefits and alternatives with him over the phone and he is agreeable to the procedure. This is scheduled with Dr. Martinique on 12/7. Follow-up with me after.  Time Spent Directly with Patient:  I have spent a total of 25 minutes with patient reviewing hospital notes, telemetry, EKGs, labs and examining the patient as well as establishing an assessment and plan that was discussed with the patient.  > 50% of time was spent in direct patient care.   Length of Stay: @RRHLOS @  Pixie Casino, MD, [facility], [place] Director of the Advanced Lipid Disorders &  Cardiovascular Risk Reduction Clinic Diplomate of the American Board of Clinical Lipidology Attending Cardiologist  Direct Dial: [phone]  Fax: [phone]  Website:  [email or web address]   Erik Mitchell 11/18/2020, 9:22 PM

## 2020-11-18 NOTE — Telephone Encounter (Signed)
Called Mr. Casasola regarding the results of his CT coronary angiogram.  This had a lot of artifact however he is noted to have a significant amount of proximal calcification particularly in the LAD artery.  Given his high calcium score and symptom pattern, I am concerned for possible obstructive coronary disease.  I am recommending a definitive left heart catheterization.  We discussed the risk, benefits and alternatives of that study including medical therapy and he was interested in proceeding with catheterization.  He understood the risks and benefits and wished to proceed.  We will schedule cardiac catheterization in the near future based on his schedule availability.  Plan follow-up with me afterwards.  Pixie Casino, MD, [facility], [place] Director of the Advanced Lipid Disorders &  Cardiovascular Risk Reduction Clinic Diplomate of the American Board of Clinical Lipidology Attending Cardiologist  Direct Dial: [phone]   Fax: [phone]  Website:  [email or web address]

## 2020-11-19 ENCOUNTER — Telehealth (INDEPENDENT_AMBULATORY_CARE_PROVIDER_SITE_OTHER): Payer: BC Managed Care – PPO | Admitting: Internal Medicine

## 2020-11-19 ENCOUNTER — Encounter: Payer: Self-pay | Admitting: Internal Medicine

## 2020-11-19 DIAGNOSIS — R931 Abnormal findings on diagnostic imaging of heart and coronary circulation: Secondary | ICD-10-CM

## 2020-11-19 DIAGNOSIS — I200 Unstable angina: Secondary | ICD-10-CM

## 2020-11-19 NOTE — Patient Instructions (Signed)
Medication Instructions:  No Changes In Medications at this time.  *If you need a refill on your cardiac medications before your next appointment, please call your pharmacy*  Testing/Procedures: L HEART CATH AS SCHEDULED   Follow-Up: At [facility], you and your health needs are our priority.  As part of our continuing mission to provide you with exceptional heart care, we have created designated Provider Care Teams.  These Care Teams include your primary Cardiologist (physician) and Advanced Practice Providers (APPs -  Physician Assistants and Nurse Practitioners) who all work together to provide you with the care you need, when you need it.  Your next appointment:   December 17th 11:15am with Coletta Memos NP   The format for your next appointment:   In Person  Provider:   Coletta Memos- NP

## 2020-11-19 NOTE — H&P (View-Only) (Signed)
Virtual Visit via Telephone Note   This visit type was conducted due to national recommendations for restrictions regarding the COVID-19 Pandemic (e.g. social distancing) in an effort to limit this patient's exposure and mitigate transmission in our community.  Due to Erik co-morbid illnesses, this patient is at least at moderate risk for complications without adequate follow up.  This format is felt to be most appropriate for this patient at this time.  The patient did not have access to video technology/had technical difficulties with video requiring transitioning to audio format only (telephone).  All issues noted in this document were discussed and addressed.  No physical exam could be performed with this format.  Please refer to the patient's chart for Erik  consent to telehealth for [facility].   Date:  11/19/2020   ID:  Erik Mitchell, DOB November 10, 1961, MRN [id number] The patient was identified using 2 identifiers.  Evaluation Performed:  Follow-Up Visit  Patient Location:  [place] [address]  Provider location:   [address], [place], [place] [address]  PCP:  Tonia Ghent, MD  Cardiologist:  Pixie Casino, MD Electrophysiologist:  None   Chief Complaint:  Follow-up CT coronary angiogram  History of Present Illness:    Erik Mitchell is a 59 y.o. male who presents via audio/video conferencing for a telehealth visit today.  Erik Mitchell is a pleasant 59 year old male previously seen by Dr. Tonye Royalty bradycardia. He has reported some exertional dyspnea and infrequent chest discomfort. He underwent exercise treadmill stress testing in 1996 and a Myoview stress test in 2003 which showed normal LVEF of 67%. He reports being able to augment heart rate with exercise. He had a repeat treadmill stress test in February 2020 which was negative as well. Heart rate increased to 146 with max exercise. He does report some nocturnal bradycardia. Erik  Mitchell is a patient of mine and she wanted him to see me for a second opinion. More recently an echo in February 2020 revealed an EF of 60 to 65% however the aortic root was mildly dilated at 4 cm. A CTA was recommended in February 2021, but has not been performed. Since he last saw Dr. Amanda Pea has had some shortness of breath with exertion and gets some infrequent chest discomfort. He describes it as sharp or stabbing in the left anterior chest wall and lasts only for a few seconds. Sometimes with exertion but at other times can come at rest. Overall seems somewhat atypical. Heart rate today was 59. EKG demonstrated sinus bradycardia without ischemic changes. Normal axes and normal intervals.  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  The patient does not have symptoms concerning for COVID-19 infection (fever, chills, cough, or new SHORTNESS OF BREATH).    Prior CV studies:   The following studies were reviewed today:  CT  PMHx:  Past Medical History:  Diagnosis Date  . Allergic rhinitis 02/16/2003  . Allergy   . Anxiety   . GERD (gastroesophageal reflux disease) 12/18/2002  . H/O cold sores    valtrex suppression  . History of ETT 11/96   wnl, cardiology consult0 [facility] 01/97  . Hyperlipemia 08/19/1995  . Hypertension 08/19/95  . Shingles     Past Surgical History:  Procedure Laterality Date  . TENDON REPAIR     R 4th finger  . TONSILLECTOMY      FAMHx:  Family History  Problem Relation Age of Onset  . Diabetes Mother   . Heart disease Mother   . Cancer Mother   . Colon cancer Mother        presumed  . Heart disease Father        MI CABG x 3 Pacer  . Diabetes Brother   . Heart disease Brother        MI x 2 Stents  . Prostate  cancer Neg Hx   . Rectal cancer Neg Hx   . Stomach cancer Neg Hx     SOCHx:   reports that he has never smoked. He has never used smokeless tobacco. He reports current alcohol use. He reports that he does not use drugs.  ALLERGIES:  Allergies  Allergen Reactions  . Bee Venom     Swelling, short of breath  . Codeine Other (See Comments)    unknown  . Meloxicam Other (See Comments)    GI upset  . Naproxen Other (See Comments)    GI upset    MEDS:  No outpatient medications have been marked as taking for the 11/19/20 encounter (Appointment) with Pixie Casino, MD.     ROS: Pertinent items noted in HPI and remainder of comprehensive ROS otherwise negative.  Labs/Other Tests and Data Reviewed:    Recent Labs: 01/12/2020: ALT 14; BUN 18; Creatinine, Ser 0.95; Potassium 4.2; Sodium 140   Recent Lipid Panel Lab Results  Component Value Date/Time   CHOL 160 01/12/2020 03:26 PM   TRIG 84.0 01/12/2020 03:26 PM   HDL 34.80 (L) 01/12/2020 03:26 PM   CHOLHDL 5 01/12/2020 03:26 PM   LDLCALC 108 (H) 01/12/2020 03:26 PM   LDLCALC 157 (H) 10/04/2018 03:50 PM   LDLDIRECT 84.0 01/07/2019 04:52 PM    Wt Readings from Last 3 Encounters:  10/04/20 234 lb 12.8 oz (106.5 kg)  08/05/20 235 lb 2 oz (106.7 kg)  01/19/20 230 lb 8 oz (104.6 kg)     Exam:    Vital Signs:  There were no vitals taken for this visit.   Exam not performed due to virtual visit  ASSESSMENT & PLAN:    1. Abnormal coronary CT angiogram 2. Chest pain  [name] had an abnormal CT coronary angiogram showing heavy calcification, especially of the proximal LAD. There was also artifact that confounded the study and would make an FFR evaluation inconclusive. Based on Erik continued symptoms as well as heavy calcium burden, would recommend a definitive left heart cath and possible PCI. I discussed the risks, benefits and alternatives with him over the phone and he is agreeable to the procedure. This is scheduled  with Dr. Martinique on 12/7. Follow-up with me after.  COVID-19 Education: The signs and symptoms of COVID-19 were discussed with the patient and how to seek care for testing (follow up with PCP or arrange E-visit).  The importance of social distancing was discussed today.  Patient Risk:   After full review of this patients clinical status, I feel that they are at least moderate risk at this time.  Time:   Today, I have spent 15 minutes with the patient with telehealth technology discussing cardiac cath and CT results.     Medication Adjustments/Labs and Tests Ordered: Current medicines are reviewed at length with the patient today.  Concerns regarding medicines are outlined above.  Tests Ordered: No orders of the defined types were placed in this encounter.   Medication Changes: No orders of the defined types were placed in this encounter.   Disposition:  in 1 month(s)  Pixie Casino, MD, [facility], [place] Director of the Advanced Lipid Disorders &  Cardiovascular Risk Reduction Clinic Diplomate of the American Board of Clinical Lipidology Attending Cardiologist  Direct Dial: [phone]  Fax: [phone]  Website:  [email or web address]  Pixie Casino, MD  11/19/2020 3:48 PM

## 2020-11-19 NOTE — Progress Notes (Signed)
Virtual Visit via Telephone Note   This visit type was conducted due to national recommendations for restrictions regarding the COVID-19 Pandemic (e.g. social distancing) in an effort to limit this patient's exposure and mitigate transmission in our community.  Due to his co-morbid illnesses, this patient is at least at moderate risk for complications without adequate follow up.  This format is felt to be most appropriate for this patient at this time.  The patient did not have access to video technology/had technical difficulties with video requiring transitioning to audio format only (telephone).  All issues noted in this document were discussed and addressed.  No physical exam could be performed with this format.  Please refer to the patient's chart for his  consent to telehealth for [facility].   Date:  11/19/2020   ID:  Erik Mitchell, DOB Feb 28, 1961, MRN [id number] The patient was identified using 2 identifiers.  Evaluation Performed:  Follow-Up Visit  Patient Location:  [place] [id number]  Provider location:   [address], [place], [place] [address]  PCP:  Tonia Ghent, MD  Cardiologist:  Pixie Casino, MD Electrophysiologist:  None   Chief Complaint:  Follow-up CT coronary angiogram  History of Present Illness:    Erik Mitchell is a 59 y.o. male who presents via audio/video conferencing for a telehealth visit today.  Erik Mitchell is a pleasant 59 year old male previously seen by Dr. Tonye Royalty bradycardia. He has reported some exertional dyspnea and infrequent chest discomfort. He underwent exercise treadmill stress testing in 1996 and a Myoview stress test in 2003 which showed normal LVEF of 67%. He reports being able to augment heart rate with exercise. He had a repeat treadmill stress test in February 2020 which was negative as well. Heart rate increased to 146 with max exercise. He does report some nocturnal bradycardia. His  wife is a patient of mine and she wanted him to see me for a second opinion. More recently an echo in February 2020 revealed an EF of 60 to 65% however the aortic root was mildly dilated at 4 cm. A CTA was recommended in February 2021, but has not been performed. Since he last saw Dr. Amanda Pea has had some shortness of breath with exertion and gets some infrequent chest discomfort. He describes it as sharp or stabbing in the left anterior chest wall and lasts only for a few seconds. Sometimes with exertion but at other times can come at rest. Overall seems somewhat atypical. Heart rate today was 59. EKG demonstrated sinus bradycardia without ischemic changes. Normal axes and normal intervals.  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  The patient does not have symptoms concerning for COVID-19 infection (fever, chills, cough, or new SHORTNESS OF BREATH).    Prior CV studies:   The following studies were reviewed today:  CT  PMHx:  Past Medical History:  Diagnosis Date  . Allergic rhinitis 02/16/2003  . Allergy   . Anxiety   . GERD (gastroesophageal reflux disease) 12/18/2002  . H/O cold sores    valtrex suppression  . History of ETT 11/96   wnl, cardiology consult0 [facility] 01/97  . Hyperlipemia 08/19/1995  . Hypertension 08/19/95  . Shingles     Past Surgical History:  Procedure Laterality Date  . TENDON REPAIR     R 4th finger  . TONSILLECTOMY      FAMHx:  Family History  Problem Relation Age of Onset  . Diabetes Mother   . Heart disease Mother   . Cancer Mother   . Colon cancer Mother        presumed  . Heart disease Father        MI CABG x 3 Pacer  . Diabetes Brother   . Heart disease Brother        MI x 2 Stents  . Prostate  cancer Neg Hx   . Rectal cancer Neg Hx   . Stomach cancer Neg Hx     SOCHx:   reports that he has never smoked. He has never used smokeless tobacco. He reports current alcohol use. He reports that he does not use drugs.  ALLERGIES:  Allergies  Allergen Reactions  . Bee Venom     Swelling, short of breath  . Codeine Other (See Comments)    unknown  . Meloxicam Other (See Comments)    GI upset  . Naproxen Other (See Comments)    GI upset    MEDS:  No outpatient medications have been marked as taking for the 11/19/20 encounter (Appointment) with Pixie Casino, MD.     ROS: Pertinent items noted in HPI and remainder of comprehensive ROS otherwise negative.  Labs/Other Tests and Data Reviewed:    Recent Labs: 01/12/2020: ALT 14; BUN 18; Creatinine, Ser 0.95; Potassium 4.2; Sodium 140   Recent Lipid Panel Lab Results  Component Value Date/Time   CHOL 160 01/12/2020 03:26 PM   TRIG 84.0 01/12/2020 03:26 PM   HDL 34.80 (L) 01/12/2020 03:26 PM   CHOLHDL 5 01/12/2020 03:26 PM   LDLCALC 108 (H) 01/12/2020 03:26 PM   LDLCALC 157 (H) 10/04/2018 03:50 PM   LDLDIRECT 84.0 01/07/2019 04:52 PM    Wt Readings from Last 3 Encounters:  10/04/20 234 lb 12.8 oz (106.5 kg)  08/05/20 235 lb 2 oz (106.7 kg)  01/19/20 230 lb 8 oz (104.6 kg)     Exam:    Vital Signs:  There were no vitals taken for this visit.   Exam not performed due to virtual visit  ASSESSMENT & PLAN:    1. Abnormal coronary CT angiogram 2. Chest pain  Erik Mitchell had an abnormal CT coronary angiogram showing heavy calcification, especially of the proximal LAD. There was also artifact that confounded the study and would make an FFR evaluation inconclusive. Based on his continued symptoms as well as heavy calcium burden, would recommend a definitive left heart cath and possible PCI. I discussed the risks, benefits and alternatives with him over the phone and he is agreeable to the procedure. This is scheduled  with Dr. Martinique on 12/7. Follow-up with me after.  COVID-19 Education: The signs and symptoms of COVID-19 were discussed with the patient and how to seek care for testing (follow up with PCP or arrange E-visit).  The importance of social distancing was discussed today.  Patient Risk:   After full review of this patients clinical status, I feel that they are at least moderate risk at this time.  Time:   Today, I have spent 15 minutes with the patient with telehealth technology discussing cardiac cath and CT results.     Medication Adjustments/Labs and Tests Ordered: Current medicines are reviewed at length with the patient today.  Concerns regarding medicines are outlined above.  Tests Ordered: No orders of the defined types were placed in this encounter.   Medication Changes: No orders of the defined types were placed in this encounter.   Disposition:  in 1 month(s)  Pixie Casino, MD, [facility], [place] Director of the [facility] Lipid [facility]  Cardiovascular [facility] Diplomate of the American Board of Clinical Lipidology Attending Cardiologist  Direct Dial: [phone]  Fax: [phone]  Website:  [email or web address]  Pixie Casino, MD  11/19/2020 3:48 PM

## 2020-11-20 ENCOUNTER — Other Ambulatory Visit (HOSPITAL_COMMUNITY)
Admission: RE | Admit: 2020-11-20 | Discharge: 2020-11-20 | Disposition: A | Discharge status: Home / Self Care | Payer: BC Managed Care – PPO | Source: Ambulatory Visit | Attending: Cardiology | Admitting: Cardiology

## 2020-11-20 DIAGNOSIS — R079 Chest pain, unspecified: Secondary | ICD-10-CM | POA: Diagnosis not present

## 2020-11-20 DIAGNOSIS — I2510 Atherosclerotic heart disease of native coronary artery without angina pectoris: Secondary | ICD-10-CM | POA: Diagnosis not present

## 2020-11-20 DIAGNOSIS — Z20822 Contact with and (suspected) exposure to covid-19: Secondary | ICD-10-CM | POA: Insufficient documentation

## 2020-11-20 DIAGNOSIS — Z01812 Encounter for preprocedural laboratory examination: Secondary | ICD-10-CM | POA: Insufficient documentation

## 2020-11-20 DIAGNOSIS — Z886 Allergy status to analgesic agent status: Secondary | ICD-10-CM | POA: Diagnosis not present

## 2020-11-20 DIAGNOSIS — Z888 Allergy status to other drugs, medicaments and biological substances status: Secondary | ICD-10-CM | POA: Diagnosis not present

## 2020-11-20 DIAGNOSIS — Z885 Allergy status to narcotic agent status: Secondary | ICD-10-CM | POA: Diagnosis not present

## 2020-11-20 LAB — BASIC METABOLIC PANEL
BUN/Creatinine Ratio: 20 (ref 9–20)
BUN: 21 mg/dL (ref 6–24)
CO2: 23 mmol/L (ref 20–29)
Calcium: 9.8 mg/dL (ref 8.7–10.2)
Chloride: 105 mmol/L (ref 96–106)
Creatinine, Ser: 1.05 mg/dL (ref 0.76–1.27)
GFR calc Af Amer: 90 mL/min/{1.73_m2} (ref >59)
GFR calc non Af Amer: 78 mL/min/{1.73_m2} (ref >59)
Glucose: 91 mg/dL (ref 65–99)
Potassium: 4.2 mmol/L (ref 3.5–5.2)
Sodium: 144 mmol/L (ref 134–144)

## 2020-11-20 LAB — CBC
Hematocrit: 44.1 % (ref 37.5–51.0)
Hemoglobin: 15.1 g/dL (ref 13.0–17.7)
MCH: 28.4 pg (ref 26.6–33.0)
MCHC: 34.2 g/dL (ref 31.5–35.7)
MCV: 83 fL (ref 79–97)
Platelets: 218 10*3/uL (ref 150–450)
RBC: 5.31 x10E6/uL (ref 4.14–5.80)
RDW: 12.4 % (ref 11.6–15.4)
WBC: 6.1 10*3/uL (ref 3.4–10.8)

## 2020-11-20 LAB — SARS CORONAVIRUS 2 (TAT 6-24 HRS): SARS Coronavirus 2: NEGATIVE

## 2020-11-22 ENCOUNTER — Telehealth: Payer: Self-pay | Admitting: *Deleted

## 2020-11-22 NOTE — Telephone Encounter (Signed)
Pt contacted pre-catheterization scheduled at [facility] for: Tuesday November 23, 2020 10:30 AM Verified arrival time and place: [facility]) at: 8:30 AM  No solid food after midnight prior to cath, clear liquids until 5 AM day of procedure.   AM meds can be  taken pre-cath with sips of water including: ASA 81 mg   Confirmed patient has responsible adult to drive home post procedure and be with patient first 24 hours after arriving home:  You are allowed ONE visitor in the waiting room during the time you are at the hospital for your procedure. Both you and your visitor must wear a mask once you enter the hospital.       COVID-19 Pre-Screening Questions:  . In the past 14 days have you had any symptoms concerning for COVID-19 infection (fever, chills, cough, or new shortness of breath)? . In the past 14 days have you been around anyone with known Covid 19?     LMTCB to review procedure instructions with patient.

## 2020-11-22 NOTE — Telephone Encounter (Addendum)
Reviewed procedure/mask/visitor instructions, COVID-19 questions with patient. 

## 2020-11-23 ENCOUNTER — Other Ambulatory Visit: Payer: Self-pay

## 2020-11-23 ENCOUNTER — Encounter (HOSPITAL_COMMUNITY): Payer: Self-pay | Admitting: Cardiology

## 2020-11-23 ENCOUNTER — Encounter (HOSPITAL_COMMUNITY)
Admission: RE | Disposition: A | Discharge status: Home / Self Care | Payer: Self-pay | Source: Home / Self Care | Attending: Cardiology

## 2020-11-23 ENCOUNTER — Ambulatory Visit (HOSPITAL_COMMUNITY)
Admission: RE | Admit: 2020-11-23 | Discharge: 2020-11-23 | Disposition: A | Discharge status: Home / Self Care | Payer: BC Managed Care – PPO | Attending: Cardiology | Admitting: Cardiology

## 2020-11-23 DIAGNOSIS — Z886 Allergy status to analgesic agent status: Secondary | ICD-10-CM | POA: Insufficient documentation

## 2020-11-23 DIAGNOSIS — I10 Essential (primary) hypertension: Secondary | ICD-10-CM | POA: Diagnosis present

## 2020-11-23 DIAGNOSIS — Z885 Allergy status to narcotic agent status: Secondary | ICD-10-CM | POA: Diagnosis not present

## 2020-11-23 DIAGNOSIS — Z20822 Contact with and (suspected) exposure to covid-19: Secondary | ICD-10-CM | POA: Insufficient documentation

## 2020-11-23 DIAGNOSIS — I2510 Atherosclerotic heart disease of native coronary artery without angina pectoris: Secondary | ICD-10-CM

## 2020-11-23 DIAGNOSIS — I2584 Coronary atherosclerosis due to calcified coronary lesion: Secondary | ICD-10-CM

## 2020-11-23 DIAGNOSIS — I77819 Aortic ectasia, unspecified site: Secondary | ICD-10-CM | POA: Diagnosis present

## 2020-11-23 DIAGNOSIS — I77810 Thoracic aortic ectasia: Secondary | ICD-10-CM

## 2020-11-23 DIAGNOSIS — R0602 Shortness of breath: Secondary | ICD-10-CM

## 2020-11-23 DIAGNOSIS — R079 Chest pain, unspecified: Secondary | ICD-10-CM | POA: Diagnosis present

## 2020-11-23 DIAGNOSIS — Z888 Allergy status to other drugs, medicaments and biological substances status: Secondary | ICD-10-CM | POA: Insufficient documentation

## 2020-11-23 DIAGNOSIS — Z8249 Family history of ischemic heart disease and other diseases of the circulatory system: Secondary | ICD-10-CM

## 2020-11-23 DIAGNOSIS — E785 Hyperlipidemia, unspecified: Secondary | ICD-10-CM | POA: Diagnosis present

## 2020-11-23 DIAGNOSIS — R072 Precordial pain: Secondary | ICD-10-CM

## 2020-11-23 HISTORY — PX: LEFT HEART CATH AND CORONARY ANGIOGRAPHY: CATH118249

## 2020-11-23 SURGERY — LEFT HEART CATH AND CORONARY ANGIOGRAPHY
Anesthesia: LOCAL

## 2020-11-23 MED ORDER — SODIUM CHLORIDE 0.9 % WEIGHT BASED INFUSION
3.0000 mL/kg/h | INTRAVENOUS | Status: AC
  Administered 2020-11-23: 3 mL/kg/h via INTRAVENOUS

## 2020-11-23 MED ORDER — MIDAZOLAM HCL 2 MG/2ML IJ SOLN
INTRAMUSCULAR | Status: AC
  Filled 2020-11-23: qty 2

## 2020-11-23 MED ORDER — HEPARIN SODIUM (PORCINE) 1000 UNIT/ML IJ SOLN
INTRAMUSCULAR | Status: AC
  Filled 2020-11-23: qty 1

## 2020-11-23 MED ORDER — SODIUM CHLORIDE 0.9% FLUSH: 3.0000 mL | INTRAVENOUS | Status: DC | PRN

## 2020-11-23 MED ORDER — FENTANYL CITRATE (PF) 100 MCG/2ML IJ SOLN
INTRAMUSCULAR | Status: DC | PRN
  Administered 2020-11-23: 25 ug via INTRAVENOUS

## 2020-11-23 MED ORDER — ACETAMINOPHEN 325 MG PO TABS: 650.0000 mg | ORAL_TABLET | ORAL | Status: DC | PRN

## 2020-11-23 MED ORDER — IOHEXOL 350 MG/ML SOLN
INTRAVENOUS | Status: DC | PRN
  Administered 2020-11-23: 85 mL

## 2020-11-23 MED ORDER — VERAPAMIL HCL 2.5 MG/ML IV SOLN: INTRAVENOUS | Status: DC | PRN

## 2020-11-23 MED ORDER — SODIUM CHLORIDE 0.9% FLUSH: 3.0000 mL | Freq: Two times a day (BID) | INTRAVENOUS | Status: DC

## 2020-11-23 MED ORDER — ONDANSETRON HCL 4 MG/2ML IJ SOLN: 4.0000 mg | Freq: Four times a day (QID) | INTRAMUSCULAR | Status: DC | PRN

## 2020-11-23 MED ORDER — HEPARIN (PORCINE) IN NACL 1000-0.9 UT/500ML-% IV SOLN
INTRAVENOUS | Status: DC | PRN
  Administered 2020-11-23 (×2): 500 mL

## 2020-11-23 MED ORDER — HEPARIN (PORCINE) IN NACL 1000-0.9 UT/500ML-% IV SOLN
INTRAVENOUS | Status: AC
  Filled 2020-11-23: qty 500

## 2020-11-23 MED ORDER — ASPIRIN 81 MG PO CHEW: 81.0000 mg | CHEWABLE_TABLET | ORAL | Status: DC

## 2020-11-23 MED ORDER — SODIUM CHLORIDE 0.9 % IV SOLN: 250.0000 mL | INTRAVENOUS | Status: DC | PRN

## 2020-11-23 MED ORDER — LIDOCAINE HCL (PF) 1 % IJ SOLN
INTRAMUSCULAR | Status: AC
  Filled 2020-11-23: qty 30

## 2020-11-23 MED ORDER — FENTANYL CITRATE (PF) 100 MCG/2ML IJ SOLN
INTRAMUSCULAR | Status: AC
  Filled 2020-11-23: qty 2

## 2020-11-23 MED ORDER — VERAPAMIL HCL 2.5 MG/ML IV SOLN
INTRAVENOUS | Status: AC
  Filled 2020-11-23: qty 2

## 2020-11-23 MED ORDER — LIDOCAINE HCL (PF) 1 % IJ SOLN
INTRAMUSCULAR | Status: DC | PRN
  Administered 2020-11-23: 2 mL

## 2020-11-23 MED ORDER — SODIUM CHLORIDE 0.9 % WEIGHT BASED INFUSION: 1.0000 mL/kg/h | INTRAVENOUS | Status: DC

## 2020-11-23 MED ORDER — MIDAZOLAM HCL 2 MG/2ML IJ SOLN
INTRAMUSCULAR | Status: DC | PRN
  Administered 2020-11-23: 2 mg via INTRAVENOUS

## 2020-11-23 MED ORDER — HEPARIN SODIUM (PORCINE) 1000 UNIT/ML IJ SOLN
INTRAMUSCULAR | Status: DC | PRN
  Administered 2020-11-23: 5000 [IU] via INTRAVENOUS

## 2020-11-23 SURGICAL SUPPLY — 11 items
CATH 5FR JL3.5 JR4 ANG PIG MP (CATHETERS) ×2 IMPLANT
CATH EXPO 5F MPA-1 (CATHETERS) ×2 IMPLANT
DEVICE RAD COMP TR BAND LRG (VASCULAR PRODUCTS) ×2 IMPLANT
GLIDESHEATH SLEND SS 6F .021 (SHEATH) ×2 IMPLANT
GUIDEWIRE INQWIRE 1.5J.035X260 (WIRE) ×1 IMPLANT
INQWIRE 1.5J .035X260CM (WIRE) ×2
KIT HEART LEFT (KITS) ×2 IMPLANT
PACK CARDIAC CATHETERIZATION (CUSTOM PROCEDURE TRAY) ×2 IMPLANT
SYR MEDRAD MARK 7 150ML (SYRINGE) IMPLANT
TRANSDUCER W/STOPCOCK (MISCELLANEOUS) ×2 IMPLANT
TUBING CIL FLEX 10 FLL-RA (TUBING) ×2 IMPLANT

## 2020-11-23 NOTE — Interval H&P Note (Signed)
History and Physical Interval Note:  11/23/2020 9:50 AM  Erik Mitchell  has presented today for surgery, with the diagnosis of abnormal CT, chest pain.  The various methods of treatment have been discussed with the patient and family. After consideration of risks, benefits and other options for treatment, the patient has consented to  Procedure(s): LEFT HEART CATH AND CORONARY ANGIOGRAPHY (N/A) as a surgical intervention.  The patient's history has been reviewed, patient examined, no change in status, stable for surgery.  I have reviewed the patient's chart and labs.  Questions were answered to the patient's satisfaction.   Cath Lab Visit (complete for each Cath Lab visit)  Clinical Evaluation Leading to the Procedure:   ACS: No.  Non-ACS:    Anginal Classification: CCS II  Anti-ischemic medical therapy: No Therapy  Non-Invasive Test Results: Intermediate-risk stress test findings: cardiac mortality 1-3%/year  Prior CABG: No previous CABG        Collier [facility] 11/23/2020 9:51 AM

## 2020-11-23 NOTE — Progress Notes (Signed)
Discharge instructions reviewed with pt and his wife (via telephone) both voice understanding.  

## 2020-11-23 NOTE — Discharge Instructions (Signed)
Drink plenty of fluids for 24 hours Keep Right arm above heart level for 24 hours Radial Site Care  This sheet gives you information about how to care for yourself after your procedure. Your health care provider may also give you more specific instructions. If you have problems or questions, contact your health care provider. What can I expect after the procedure? After the procedure, it is common to have:  Bruising and tenderness at the catheter insertion area. Follow these instructions at home: Medicines  Take over-the-counter and prescription medicines only as told by your health care provider. Insertion site care  Follow instructions from your health care provider about how to take care of your insertion site. Make sure you: ? Wash your hands with soap and water before you change your bandage (dressing). If soap and water are not available, use hand sanitizer. ? Change your dressing as told by your health care provider. ? Leave stitches (sutures), skin glue, or adhesive strips in place. These skin closures may need to stay in place for 2 weeks or longer. If adhesive strip edges start to loosen and curl up, you may trim the loose edges. Do not remove adhesive strips completely unless your health care provider tells you to do that.  Check your insertion site every day for signs of infection. Check for: ? Redness, swelling, or pain. ? Fluid or blood. ? Pus or a bad smell. ? Warmth.  Do not take baths, swim, or use a hot tub until your health care provider approves.  You may shower 24-48 hours after the procedure, or as directed by your health care provider. ? Remove the dressing and gently wash the site with plain soap and water. ? Pat the area dry with a clean towel. ? Do not rub the site. That could cause bleeding.  Do not apply powder or lotion to the site. Activity   For 24 hours after the procedure, or as directed by your health care provider: ? Do not flex or bend the  affected arm. ? Do not push or pull heavy objects with the affected arm. ? Do not drive yourself home from the hospital or clinic. You may drive 24 hours after the procedure unless your health care provider tells you not to. ? Do not operate machinery or power tools.  Do not lift anything that is heavier than 10 lb (4.5 kg), or the limit that you are told, until your health care provider says that it is safe.  Ask your health care provider when it is okay to: ? Return to work or school. ? Resume usual physical activities or sports. ? Resume sexual activity. General instructions  If the catheter site starts to bleed, raise your arm and put firm pressure on the site. If the bleeding does not stop, get help right away. This is a medical emergency.  If you went home on the same day as your procedure, a responsible adult should be with you for the first 24 hours after you arrive home.  Keep all follow-up visits as told by your health care provider. This is important. Contact a health care provider if:  You have a fever.  You have redness, swelling, or yellow drainage around your insertion site. Get help right away if:  You have unusual pain at the radial site.  The catheter insertion area swells very fast.  The insertion area is bleeding, and the bleeding does not stop when you hold steady pressure on the area.  Your  arm or hand becomes pale, cool, tingly, or numb. These symptoms may represent a serious problem that is an emergency. Do not wait to see if the symptoms will go away. Get medical help right away. Call your local emergency services (911 in the [place] Do not drive yourself to the hospital. Summary  After the procedure, it is common to have bruising and tenderness at the site.  Follow instructions from your health care provider about how to take care of your radial site wound. Check the wound every day for signs of infection.  Do not lift anything that is heavier than 10  lb (4.5 kg), or the limit that you are told, until your health care provider says that it is safe. This information is not intended to replace advice given to you by your health care provider. Make sure you discuss any questions you have with your health care provider. Document Revised: 01/09/2018 Document Reviewed: 01/09/2018 Elsevier Patient Education  2020 [employer].

## 2020-11-30 NOTE — Progress Notes (Signed)
Virtual Visit via Telephone Note   This visit type was conducted due to national recommendations for restrictions regarding the COVID-19 Pandemic (e.g. social distancing) in an effort to limit this patient's exposure and mitigate transmission in our community.  Due to his co-morbid illnesses, this patient is at least at moderate risk for complications without adequate follow up.  This format is felt to be most appropriate for this patient at this time.  The patient did not have access to video technology/had technical difficulties with video requiring transitioning to audio format only (telephone).  All issues noted in this document were discussed and addressed.  No physical exam could be performed with this format.  Please refer to the patient's chart for his  consent to telehealth for [facility].  Evaluation Performed:  Follow-up visit  This visit type was conducted due to national recommendations for restrictions regarding the COVID-19 Pandemic (e.g. social distancing).  This format is felt to be most appropriate for this patient at this time.  All issues noted in this document were discussed and addressed.  No physical exam was performed (except for noted visual exam findings with Video Visits).  Please refer to the patient's chart (MyChart message for video visits and phone note for telephone visits) for the patient's consent to telehealth for [place]  Date:  12/03/2020   ID:  Erik Mitchell, DOB 07-13-1961, MRN [id number]  Patient Location:  [address] [address]   Provider location:     [place] [address] Office [phone] Fax [phone]   PCP:  Tonia Ghent, MD  Cardiologist:  Pixie Casino, MD  Electrophysiologist:  None   Chief Complaint: Follow-up for coronary artery disease  History of Present Illness:    Erik Mitchell is a 59 y.o. male who presents via audio/video  conferencing for a telehealth visit today.  Patient verified DOB and address.  Erik Mitchell 59 year old male presents the clinic today for follow-up evaluation of his coronary artery disease status post [facility] 11/23/2020.  [name] has a PMH of bradycardia, essential hypertension, aortic dilation, GERD, HLD, and obesity.   He underwent an exercise stress test in 1996 and a nuclear stress test in 2003 which showed normal LV function. He underwent repeat treadmill stress testing 2/21 which was negative. It was noted that his heart rate increased to a maximum of 146 bpm. He underwent echocardiogram 2/20 which showed an EF of 60-65% and aortic root dilation at 4 cm. Due to infrequent episodes of chest discomfort a coronary CTA was recommended. His cardiac CT 11/16/2019 showed moderate to severe three-vessel coronary artery disease in his proximal LAD, his coronary calcium score was 882. His ascending aorta was dilated to 4.3 cm and his pulmonary artery was dilated suggesting pulmonary hypertension. His CT was complicated by artifact which would make his FFR evaluation inconclusive. He was contacted and scheduled for LHC with Dr. Martinique 11/23/2020 for conclusive results. His cardiac catheterization showed proximal-mid LAD 25% stenosis, normal LVEDP, and normal LVEF.  He is seen virtually today and states he continues to have some dull type chest pain.  He describes this as constant.  We reviewed his coronary CTA and his cardiac catheterization.  He reported that he has been told his atorvastatin would be increased to 40 mg.  The prescription had not yet been updated.  We discussed the importance of high-fiber diet and increasing physical activity.  He expressed understanding.  We also reviewed his echocardiogram and his aortic root dilation.  I will increase his atorvastatin to 40 mg, order a fasting lipid panel and LFTs in 8 weeks, order a repeat echocardiogram for 1 year and have him follow-up with Dr. Debara Pickett as  scheduled.  He was originally scheduled for an office visit today.  However, he contacted nurse triage line yesterday and reported that he had been in close proximity to individuals with COVID-19.  He asked where he should go to have a Covid test.  I directed him to [place].  Today he denies chest pain, shortness of breath, lower extremity edema, fatigue, palpitations, melena, hematuria, hemoptysis, diaphoresis, weakness, presyncope, syncope, orthopnea, and PND.   The patient does not symptoms concerning for COVID-19 infection (fever, chills, cough, or new SHORTNESS OF BREATH).    Prior CV studies:   The following studies were reviewed today:  Echocardiogram 01/23/2019 IMPRESSIONS     1. The left ventricle has normal systolic function of 79-89%. The cavity  size was normal. There is mildly increased left ventricular wall  thickness. Echo evidence of normal diastolic relaxation.   2. The right ventricle has normal systolic function. The cavity was  normal. There is no increase in right ventricular wall thickness. Right  ventricular systolic pressure normal with an estimated pressure of 21.7  mmHg.   3. Left atrial size was mildly dilated.   4. The mitral valve is normal in structure.   5. The tricuspid valve is normal in structure.   6. The aortic valve is normal in structure.   7. The pulmonic valve was normal in structure.   8. There is mild dilatation of the ascending aorta.   Coronary CTA 11/15/2020 IMPRESSION: 1. 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 Significant artifacts as described will likely preclude CT FFR analysis.   2. Coronary calcium score of 882. This was 97th percentile for age and sex matched control.   3. Normal coronary origin with left dominance.   4. Dilated main pulmonary artery to 33 mm, suggestive of pulmonary hypertension.   5. 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    6. Aortic atherosclerosis   7. Definitive cardiac catheterization is recommended.  Cardiac catheterization 11/23/2020  Prox LAD to Mid LAD lesion is 25% stenosed.  The left ventricular systolic function is normal.  LV end diastolic pressure is normal.  The left ventricular ejection fraction is 55-65% by visual estimate.   1. Mild nonobstructive CAD 2. Normal LV function 3. Normal LVEDP   Plan: risk factor modification.  Diagnostic Dominance: Left    Intervention    Past Medical History:  Diagnosis Date   Allergic rhinitis 02/16/2003   Allergy    Anxiety    GERD (gastroesophageal reflux disease) 12/18/2002   H/O cold sores    valtrex suppression   History of ETT 11/96   wnl, cardiology consult0 [place] 01/97   Hyperlipemia 08/19/1995   Hypertension 08/19/95   Shingles    Past Surgical History:  Procedure Laterality Date   LEFT HEART CATH AND CORONARY ANGIOGRAPHY N/A 11/23/2020   Procedure: LEFT HEART CATH AND CORONARY ANGIOGRAPHY;  Surgeon: Martinique, Peter M, MD;  Location: [facility];  Service: Cardiovascular;  Laterality: N/A;   TENDON REPAIR     R 4th finger   TONSILLECTOMY       Current Meds  Medication Sig  Ascorbic Acid (VITAMIN C PO) Take 1 tablet by mouth at bedtime.    aspirin 81 MG tablet Take 81 mg by mouth at bedtime.   atorvastatin (LIPITOR) 10 MG tablet Take 1 tablet (10 mg total) by mouth at bedtime. (Patient taking differently: Take 40 mg by mouth at bedtime.)   esomeprazole (NEXIUM) 40 MG capsule TAKE 1 CAPSULE BY MOUTH EVERY DAY (Patient taking differently: Take 40 mg by mouth at bedtime.)   valACYclovir (VALTREX) 500 MG tablet Take 1 tablet (500 mg total) by mouth daily. (Patient taking differently: Take 500 mg by mouth at bedtime.)   zinc gluconate 50 MG tablet Take 50 mg by mouth at bedtime.      Allergies:   Bee venom, Codeine, Meloxicam, and Naproxen   Social History   Tobacco Use   Smoking status:  Never Smoker   Smokeless tobacco: Never Used  Substance Use Topics   Alcohol use: Yes    Alcohol/week: 0.0 standard drinks    Comment: very little   Drug use: No     Family Hx: The patient's family history includes Cancer in his mother; Colon cancer in his mother; Diabetes in his brother and mother; Heart disease in his brother, father, and mother. There is no history of Prostate cancer, Rectal cancer, or Stomach cancer.  ROS:   Please see the history of present illness.     All other systems reviewed and are negative.   Labs/Other Tests and Data Reviewed:    Recent Labs: 01/12/2020: ALT 14 11/19/2020: BUN 21; Creatinine, Ser 1.05; Hemoglobin 15.1; Platelets 218; Potassium 4.2; Sodium 144   Recent Lipid Panel Lab Results  Component Value Date/Time   CHOL 160 01/12/2020 03:26 PM   TRIG 84.0 01/12/2020 03:26 PM   HDL 34.80 (L) 01/12/2020 03:26 PM   CHOLHDL 5 01/12/2020 03:26 PM   LDLCALC 108 (H) 01/12/2020 03:26 PM   LDLCALC 157 (H) 10/04/2018 03:50 PM   LDLDIRECT 84.0 01/07/2019 04:52 PM    Wt Readings from Last 3 Encounters:  12/03/20 228 lb (103.4 kg)  11/23/20 220 lb (99.8 kg)  10/04/20 234 lb 12.8 oz (106.5 kg)     Exam:    Vital Signs:  BP (!) 134/91    Pulse 73    Wt 228 lb (103.4 kg)    BMI 34.67 kg/m    Well nourished, well developed male in no  acute distress.   ASSESSMENT & PLAN:    1.  Chest pain/abnormal coronary CT-continues with dull chest pain. Coronary CTA showed moderate-severe mixed CAD primarily in the proximal LAD.  Significant artifact would not allow CT FFR analysis.  LHC showed proximal-mid LAD 25% stenosis, normal LVEDP, and normal LV function. Continue atorvastatin, aspirin, Heart healthy low-sodium diet-salty 6 given Increase physical activity as tolerated  Essential hypertension-BP [id number].  Well-controlled at home Heart healthy low-sodium diet-salty 6 given Increase physical activity as tolerated  Dyslipidemia-01/12/2020:  Cholesterol 160; HDL 34.80; LDL Cholesterol 108; Triglycerides 84.0; VLDL 16.8 Increase atorvastatin at 40 mg daily Heart healthy low-sodium high-fiber diet Increase physical activity as tolerated Repeat fasting lipid panel and LFTs in 8 weeks  Dilated aortic root-coronary CTA showed dilated aorta 43 mm at the mid a sending aorta. Repeat echocardiogram 2022  Disposition: Follow-up with Dr. Debara Pickett in 1 months.  COVID-19 Education: The signs and symptoms of COVID-19 were discussed with the patient and how to seek care for testing (follow up with PCP or arrange E-visit).  The importance of social distancing was  discussed today.  Patient Risk:   After full review of this patients clinical status, I feel that they are at least moderate risk at this time.  Time:   Today, I have spent 19 minutes with the patient with telehealth technology discussing coronary CTA, cardiac catheterization, statin, diet, and exercise.  Prior to the visit I spent greater than 20 minutes reviewing patient's past medical history, previous cardiac tests and cardiac medications.   Medication Adjustments/Labs and Tests Ordered: Current medicines are reviewed at length with the patient today.  Concerns regarding medicines are outlined above.   Tests Ordered: No orders of the defined types were placed in this encounter.  Medication Changes: No orders of the defined types were placed in this encounter.   Disposition:  in 1 month(s)  Signed, Jossie Ng. Catalaya Garr NP-C    07/22/2019 11:58 AM    [address] Office [phone] Fax [phone]

## 2020-12-03 ENCOUNTER — Telehealth (INDEPENDENT_AMBULATORY_CARE_PROVIDER_SITE_OTHER): Payer: BC Managed Care – PPO | Admitting: General Practice

## 2020-12-03 ENCOUNTER — Encounter: Payer: Self-pay | Admitting: General Practice

## 2020-12-03 VITALS — BP 134/91 | HR 73 | Wt 228.0 lb

## 2020-12-03 DIAGNOSIS — I10 Essential (primary) hypertension: Secondary | ICD-10-CM | POA: Diagnosis not present

## 2020-12-03 DIAGNOSIS — Z79899 Other long term (current) drug therapy: Secondary | ICD-10-CM

## 2020-12-03 DIAGNOSIS — I77810 Thoracic aortic ectasia: Secondary | ICD-10-CM

## 2020-12-03 DIAGNOSIS — E785 Hyperlipidemia, unspecified: Secondary | ICD-10-CM | POA: Diagnosis not present

## 2020-12-03 DIAGNOSIS — I2510 Atherosclerotic heart disease of native coronary artery without angina pectoris: Secondary | ICD-10-CM

## 2020-12-03 DIAGNOSIS — R931 Abnormal findings on diagnostic imaging of heart and coronary circulation: Secondary | ICD-10-CM

## 2020-12-03 DIAGNOSIS — I2584 Coronary atherosclerosis due to calcified coronary lesion: Secondary | ICD-10-CM

## 2020-12-03 MED ORDER — ATORVASTATIN CALCIUM 40 MG PO TABS: 40.0000 mg | ORAL_TABLET | Freq: Every day | ORAL | 6 refills | Status: DC

## 2020-12-03 NOTE — Patient Instructions (Signed)
Medication Instructions:  TAKE ATORVASTATIN 40MG  DAILY-NEW RX SENT *If you need a refill on your cardiac medications before your next appointment, please call your pharmacy*  Lab Work: FASTING LIPID AND LFT IN 8 WEEKS (ABOUT 01-29-2020) If you have labs (blood work) drawn today and your tests are completely normal, you will receive your results only by:  [place] (if you have MyChart) OR A paper copy in the mail.  If you have any lab test that is abnormal or we need to change your treatment, we will call you to review the results. You may go to any [facility] that is convenient for you however, we do have a lab in our office that is able to assist you. You DO NOT need an appointment for our lab. The lab is open 8:00am and closes at 4:00pm. Lunch 12:45 - 1:45pm.  Testing/Procedures: Echocardiogram(DUE IN ONE YEAR) - Your physician has requested that you have an echocardiogram. Echocardiography is a painless test that uses sound waves to create images of your heart. It provides your doctor with information about the size and shape of your heart and how well your heart's chambers and valves are working. This procedure takes approximately one hour. There are no restrictions for this procedure. This will be performed at our [facility] location - [address], [address].  Special Instructions PLEASE READ AND FOLLOW HEART HEALTHY AND INCREASED FIBER DIETS ATTACHED  PLEASE READ AND FOLLOW SALTY 6-ATTACHED-1,800 mg daily  PLEASE INCREASE PHYSICAL ACTIVITY AS TOLERATED-GOAL IS150 MINUTES A WEEK. PLEASE 10% A DAY.  Follow-Up: Your next appointment:  KEEP UPCOMING APPOINTMENT  In Person with K. Mali Hilty, MD   At [facility], you and your health needs are our priority.  As part of our continuing mission to provide you with exceptional heart care, we have created designated Provider Care Teams.  These Care Teams include your primary Cardiologist (physician) and Advanced Practice Providers (APPs -   Physician Assistants and Nurse Practitioners) who all work together to provide you with the care you need, when you need it.       Fat and Cholesterol Restricted Eating Plan Getting too much fat and cholesterol in your diet may cause health problems. Choosing the right foods helps keep your fat and cholesterol at normal levels. This can keep you from getting certain diseases. What are tips for following this plan? Meal planning  At meals, divide your plate into four equal parts: ? Fill one-half of your plate with vegetables and green salads. ? Fill one-fourth of your plate with whole grains. ? Fill one-fourth of your plate with low-fat (lean) protein foods.  Eat fish that is high in omega-3 fats at least two times a week. This includes mackerel, tuna, sardines, and salmon.  Eat foods that are high in fiber, such as whole grains, beans, apples, broccoli, carrots, peas, and barley. General tips   Work with your doctor to lose weight if you need to.  Avoid: ? Foods with added sugar. ? Fried foods. ? Foods with partially hydrogenated oils.  Limit alcohol intake to no more than 1 drink a day for nonpregnant women and 2 drinks a day for men. One drink equals 12 oz of beer, 5 oz of wine, or 1 oz of hard liquor. Reading food labels  Check food labels for: ? Trans fats. ? Partially hydrogenated oils. ? Saturated fat (g) in each serving. ? Cholesterol (mg) in each serving. ? Fiber (g) in each serving.  Choose  foods with healthy fats, such as: ? Monounsaturated fats. ? Polyunsaturated fats. ? Omega-3 fats.  Choose grain products that have whole grains. Look for the word "whole" as the first word in the ingredient list. Cooking  Cook foods using low-fat methods. These include baking, boiling, grilling, and broiling.  Eat more home-cooked foods. Eat at restaurants and buffets less often.  Avoid cooking using saturated fats, such as butter, cream, palm oil, palm kernel oil, and  coconut oil. Recommended foods  Fruits  All fresh, canned (in natural juice), or frozen fruits. Vegetables  Fresh or frozen vegetables (raw, steamed, roasted, or grilled). Green salads. Grains  Whole grains, such as whole wheat or whole grain breads, crackers, cereals, and pasta. Unsweetened oatmeal, bulgur, barley, quinoa, or [place] rice. Corn or whole wheat flour tortillas. Meats and other protein foods  Ground beef (85% or leaner), grass-fed beef, or beef trimmed of fat. Skinless chicken or [place]. Ground chicken or [place]. Pork trimmed of fat. All fish and seafood. Egg whites. Dried beans, peas, or lentils. Unsalted nuts or seeds. Unsalted canned beans. Nut butters without added sugar or oil. Dairy  Low-fat or nonfat dairy products, such as skim or 1% milk, 2% or reduced-fat cheeses, low-fat and fat-free ricotta or cottage cheese, or plain low-fat and nonfat yogurt. Fats and oils  Tub margarine without trans fats. Light or reduced-fat mayonnaise and salad dressings. Avocado. Olive, canola, sesame, or safflower oils. The items listed above may not be a complete list of foods and beverages you can eat. Contact a dietitian for more information. Foods to avoid Fruits  Canned fruit in heavy syrup. Fruit in cream or butter sauce. Fried fruit. Vegetables  Vegetables cooked in cheese, cream, or butter sauce. Fried vegetables. Grains  White bread. White pasta. White rice. Cornbread. Bagels, pastries, and croissants. Crackers and snack foods that contain trans fat and hydrogenated oils. Meats and other protein foods  Fatty cuts of meat. Ribs, chicken wings, bacon, sausage, bologna, salami, chitterlings, fatback, hot dogs, bratwurst, and packaged lunch meats. Liver and organ meats. Whole eggs and egg yolks. Chicken and [place] with skin. Fried meat. Dairy  Whole or 2% milk, cream, half-and-half, and cream cheese. Whole milk cheeses. Whole-fat or sweetened yogurt. Full-fat cheeses.  Nondairy creamers and whipped toppings. Processed cheese, cheese spreads, and cheese curds. Beverages  Alcohol. Sugar-sweetened drinks such as sodas, lemonade, and fruit drinks. Fats and oils  Butter, stick margarine, lard, shortening, ghee, or bacon fat. Coconut, palm kernel, and palm oils. Sweets and desserts  Corn syrup, sugars, honey, and molasses. Candy. Jam and jelly. Syrup. Sweetened cereals. Cookies, pies, cakes, donuts, muffins, and ice cream. The items listed above may not be a complete list of foods and beverages you should avoid. Contact a dietitian for more information. Summary  Choosing the right foods helps keep your fat and cholesterol at normal levels. This can keep you from getting certain diseases.  At meals, fill one-half of your plate with vegetables and green salads.  Eat high-fiber foods, like whole grains, beans, apples, carrots, peas, and barley.  Limit added sugar, saturated fats, alcohol, and fried foods. This information is not intended to replace advice given to you by your health care provider. Make sure you discuss any questions you have with your health care provider.   High-Fiber Diet Fiber, also called dietary fiber, is a type of carbohydrate that is found in fruits, vegetables, whole grains, and beans. A high-fiber diet can have many health benefits. Your health care  provider may recommend a high-fiber diet to help:  Prevent constipation. Fiber can make your bowel movements more regular.  Lower your cholesterol.  Relieve the following conditions: ? Swelling of veins in the anus (hemorrhoids). ? Swelling and irritation (inflammation) of specific areas of the digestive tract (uncomplicated diverticulosis). ? A problem of the large intestine (colon) that sometimes causes pain and diarrhea (irritable bowel syndrome, IBS).  Prevent overeating as part of a weight-loss plan.  Prevent heart disease, type 2 diabetes, and certain cancers. What is my  plan? The recommended daily fiber intake in grams (g) includes:  38 g for men age 74 or younger.  30 g for men over age 72.  54 g for women age 107 or younger.  21 g for women over age 81. You can get the recommended daily intake of dietary fiber by:  Eating a variety of fruits, vegetables, grains, and beans.  Taking a fiber supplement, if it is not possible to get enough fiber through your diet. What do I need to know about a high-fiber diet?  It is better to get fiber through food sources rather than from fiber supplements. There is not a lot of research about how effective supplements are.  Always check the fiber content on the nutrition facts label of any prepackaged food. Look for foods that contain 5 g of fiber or more per serving.  Talk with a diet and nutrition specialist (dietitian) if you have questions about specific foods that are recommended or not recommended for your medical condition, especially if those foods are not listed below.  Gradually increase how much fiber you consume. If you increase your intake of dietary fiber too quickly, you may have bloating, cramping, or gas.  Drink plenty of water. Water helps you to digest fiber. What are tips for following this plan?  Eat a wide variety of high-fiber foods.  Make sure that half of the grains that you eat each day are whole grains.  Eat breads and cereals that are made with whole-grain flour instead of refined flour or white flour.  Eat [place] rice, bulgur wheat, or millet instead of white rice.  Start the day with a breakfast that is high in fiber, such as a cereal that contains 5 g of fiber or more per serving.  Use beans in place of meat in soups, salads, and pasta dishes.  Eat high-fiber snacks, such as berries, raw vegetables, nuts, and popcorn.  Choose whole fruits and vegetables instead of processed forms like juice or sauce. What foods can I eat?  Fruits Berries. Pears. Apples. Oranges. Avocado.  Prunes and raisins. Dried figs. Vegetables Sweet potatoes. Spinach. Kale. Artichokes. Cabbage. Broccoli. Cauliflower. Green peas. Carrots. Squash. Grains Whole-grain breads. Multigrain cereal. Oats and oatmeal. [place] rice. Barley. Bulgur wheat. [place]. Quinoa. Bran muffins. Popcorn. Rye wafer crackers. Meats and other proteins [place], kidney, and pinto beans. Soybeans. Split peas. Lentils. Nuts and seeds. Dairy Fiber-fortified yogurt. Beverages Fiber-fortified soy milk. Fiber-fortified orange juice. Other foods Fiber bars. The items listed above may not be a complete list of recommended foods and beverages. Contact a dietitian for more options. What foods are not recommended? Fruits Fruit juice. Cooked, strained fruit. Vegetables Fried potatoes. Canned vegetables. Well-cooked vegetables. Grains White bread. Pasta made with refined flour. White rice. Meats and other proteins Fatty cuts of meat. Fried chicken or fried fish. Dairy Milk. Yogurt. Cream cheese. Sour cream. Fats and oils Butters. Beverages Soft drinks. Other foods Cakes and pastries. The items listed  above may not be a complete list of foods and beverages to avoid. Contact a dietitian for more information. Summary  Fiber is a type of carbohydrate. It is found in fruits, vegetables, whole grains, and beans.  There are many health benefits of eating a high-fiber diet, such as preventing constipation, lowering blood cholesterol, helping with weight loss, and reducing your risk of heart disease, diabetes, and certain cancers.  Gradually increase your intake of fiber. Increasing too fast can result in cramping, bloating, and gas. Drink plenty of water while you increase your fiber.  The best sources of fiber include whole fruits and vegetables, whole grains, nuts, seeds, and beans. This information is not intended to replace advice given to you by your health care provider. Make sure you discuss any questions you have with  your health care provider. Document Revised: 10/08/2017 Document Reviewed: 10/08/2017 Elsevier Patient Education  2020 [employer].

## 2020-12-06 ENCOUNTER — Other Ambulatory Visit: Payer: BC Managed Care – PPO

## 2020-12-06 DIAGNOSIS — Z20822 Contact with and (suspected) exposure to covid-19: Secondary | ICD-10-CM

## 2020-12-08 ENCOUNTER — Encounter: Payer: Self-pay | Admitting: Family Medicine

## 2020-12-08 ENCOUNTER — Ambulatory Visit
Admission: EM | Admit: 2020-12-08 | Discharge: 2020-12-08 | Disposition: A | Discharge status: Home / Self Care | Payer: BC Managed Care – PPO | Attending: Emergency Medicine | Admitting: Emergency Medicine

## 2020-12-08 DIAGNOSIS — R0602 Shortness of breath: Secondary | ICD-10-CM

## 2020-12-08 DIAGNOSIS — U071 COVID-19: Secondary | ICD-10-CM | POA: Diagnosis not present

## 2020-12-08 DIAGNOSIS — Z20822 Contact with and (suspected) exposure to covid-19: Secondary | ICD-10-CM

## 2020-12-08 LAB — NOVEL CORONAVIRUS, NAA: SARS-CoV-2, NAA: DETECTED — AB

## 2020-12-08 LAB — SARS-COV-2, NAA 2 DAY TAT

## 2020-12-08 MED ORDER — BENZONATATE 100 MG PO CAPS: 100.0000 mg | ORAL_CAPSULE | Freq: Three times a day (TID) | ORAL | 0 refills | Status: DC | PRN

## 2020-12-08 MED ORDER — DEXAMETHASONE 4 MG PO TABS: 4.0000 mg | ORAL_TABLET | Freq: Every day | ORAL | 0 refills | Status: DC

## 2020-12-08 MED ORDER — DEXAMETHASONE 4 MG PO TABS: 4.0000 mg | ORAL_TABLET | Freq: Every day | ORAL | 0 refills | Status: AC

## 2020-12-08 MED ORDER — ALBUTEROL SULFATE HFA 108 (90 BASE) MCG/ACT IN AERS
1.0000 | INHALATION_SPRAY | Freq: Four times a day (QID) | RESPIRATORY_TRACT | 0 refills | Status: DC | PRN
Start: 2020-12-08 — End: 2020-12-08

## 2020-12-08 MED ORDER — FLUTICASONE PROPIONATE 50 MCG/ACT NA SUSP: 1.0000 | Freq: Every day | NASAL | 0 refills | Status: DC

## 2020-12-08 MED ORDER — CETIRIZINE HCL 10 MG PO TABS
10.0000 mg | ORAL_TABLET | Freq: Every day | ORAL | 0 refills | Status: DC
Start: 2020-12-08 — End: 2021-02-25

## 2020-12-08 MED ORDER — CETIRIZINE HCL 10 MG PO TABS: 10.0000 mg | ORAL_TABLET | Freq: Every day | ORAL | 0 refills | Status: DC

## 2020-12-08 MED ORDER — ALBUTEROL SULFATE HFA 108 (90 BASE) MCG/ACT IN AERS
1.0000 | INHALATION_SPRAY | Freq: Four times a day (QID) | RESPIRATORY_TRACT | 0 refills | Status: AC | PRN
Start: 2020-12-08 — End: ?

## 2020-12-08 NOTE — Telephone Encounter (Signed)
I spoke with pt;[facility] is going to [facility] soon as [facility] can get there. FYI to Dr Damita Dunnings and Cardell Peach RN.

## 2020-12-08 NOTE — ED Triage Notes (Signed)
Pt covid positive, presents with cough  And developing SOB. Symptoms began Tuesday

## 2020-12-08 NOTE — Telephone Encounter (Signed)
[facility] - Client TELEPHONE ADVICE RECORD [facility] Patient Name: Erik Mitchell Gender: Male DOB: [birth year] Age: 59 Y 73 M 17 D Return Phone Number: [phone] (Primary), [phone] (Secondary) Address: City/State/Zip: [place] [address] Client [facility] - Client Client Site [place] Physician Renford Dills - MD Contact Type Call Who Is Calling Patient / Member / Family / Caregiver Call Type Triage / Clinical Caller Name [name] Relationship To Patient Spouse Return Phone Number [phone] (Primary) Chief Complaint CHEST PAIN (>=21 years) - pain, pressure, heaviness or tightness Reason for Call Symptomatic / Request for Erik Mitchell states her husband has tested positive for covid. Pt has sneezing, runny nose, congestion, and cough. He has pains and burning in his chest. Erik Mitchell Not Listed Urgent Care in [place] as per caller Translation No Nurse Assessment Nurse: D'Heur Lucia Gaskins, RN, Adrienne Date/Time (Eastern Time): 12/08/2020 8:48:53 AM Confirm and document reason for call. If symptomatic, describe symptoms. ---Caller states her husband has tested positive for covid this morning. Symptoms started Saturday night. He has sneezing, runny nose, congestion, and cough. He has pains and burning in his chest. No current fever. Does the patient have any new or worsening symptoms? ---Yes Will a triage be completed? ---Yes Related visit to physician within the last 2 weeks? ---No Does the PT have any chronic conditions? (i.e. diabetes, asthma, this includes High risk factors for pregnancy, etc.) ---Yes List chronic conditions. ---Heart cath recently, HTN Is this a behavioral health or substance abuse call? ---No Guidelines Guideline Title Affirmed Question Affirmed Notes Nurse Date/Time ([place] Time) COVID-19 - Diagnosed or  Suspected MILD difficulty breathing (e.g., minimal/no SOB at rest, SOB with walking, pulse <100) D'Heur Lucia Gaskins, RN, Adrienne 12/08/2020 8:53:14 AM Disp. Time Eilene Ghazi Time) Disposition Final User 12/08/2020 8:47:08 AM Send to Urgent Queue Paulita Cradle PLEASE NOTE: All timestamps contained within this report are represented as [place] Standard Time. CONFIDENTIALTY NOTICE: This fax transmission is intended only for the addressee. It contains information that is legally privileged, confidential or otherwise protected from use or disclosure. If you are not the intended recipient, you are strictly prohibited from reviewing, disclosing, copying using or disseminating any of this information or taking any action in reliance on or regarding this information. If you have received this fax in error, please notify us immediately by telephone so that we can arrange for its return to [place]. Phone: [phone], Toll-Free: [phone], Fax: [phone] Page: 2 of 2 Call Id: [id number] 12/08/2020 8:58:16 AM See HCP within 4 Hours (or PCP triage) Yes D'Heur Lucia Gaskins, RN, Ebbie Latus Disagree/Comply Comply Caller Understands Yes PreDisposition Go to L & D Care Advice Given Per Guideline SEE HCP (OR PCP TRIAGE) WITHIN 4 HOURS: * IF OFFICE WILL BE OPEN: You need to be seen within the next 3 or 4 hours. Call your doctor (or NP/PA) now or as soon as the office opens. * Muscle aches, headache, and other pains: Often this comes and goes with the fever. Take acetaminophen every 4 to 6 hours (Adults 650 mg) OR ibuprofen every 6 to 8 hours (Adults 400 mg). Before taking any medicine, read all the instructions on the package. Comments User: [name], D'Heur Lucia Gaskins, RN Date/Time Eilene Ghazi Time): 12/08/2020 8:52:55 AM Correction: wife states he has high cholesterol rather than HTN.Marland Kitchen User: [name], D'Heur Lucia Gaskins, RN Date/Time Eilene Ghazi Time): 12/08/2020 8:55:28 AM Caller states he has a sharp pain when he  coughs.  User: [name], D'Heur Lucia Gaskins, RN Date/Time Eilene Ghazi Time): 12/08/2020 8:56:08 AM Caller states he has a dull headache. User: [name], D'Heur Lucia Gaskins, RN Date/Time Eilene Ghazi Time): 12/08/2020 9:03:56 AM Called office for warm transfer, they have no openings today. Advised caller to go to UC. Referrals Warm transfer to backline GO TO FACILITY OTHER - SPECIFY

## 2020-12-08 NOTE — Telephone Encounter (Signed)
Pt wife called in and wanted to know what to do next due to her husband has tested positive for covid-19

## 2020-12-08 NOTE — ED Provider Notes (Signed)
[name]   [id number] 12/08/20 Arrival Time: 1021   CC: COVID  infection  SUBJECTIVE: History from: patient.  Erik Mitchell is a 59 y.o. male who presented to the urgent care with a complaint of Covid infection.  Patient still has been tested for Covid 2 days ago. He is experiencing shortness of breath, cough, nasal congestion no loss of taste.  Denies no exposure to COVID.  Denies recent travel.  Has tried OTC medication without relief.  Denies of aggravating factors. Denies previous symptoms in the past.   Denies fever, chills, fatigue, sinus pain, rhinorrhea, sore throat, wheezing, chest pain, nausea, changes in bowel or bladder habits.     ROS: As per HPI.  All other pertinent ROS negative.     Past Medical History:  Diagnosis Date  . Allergic rhinitis 02/16/2003  . Allergy   . Anxiety   . GERD (gastroesophageal reflux disease) 12/18/2002  . H/O cold sores    valtrex suppression  . History of ETT 11/96   [facility]  . Hyperlipemia 08/19/1995  . Hypertension 08/19/95  . Shingles    Past Surgical History:  Procedure Laterality Date  . LEFT HEART CATH AND CORONARY ANGIOGRAPHY N/A 11/23/2020   Procedure: LEFT HEART CATH AND CORONARY ANGIOGRAPHY;  Surgeon: SwazilandJordan, Peter M, MD;  [facility];  Service: Cardiovascular;  Laterality: N/A;  . TENDON REPAIR     R 4th finger  . TONSILLECTOMY     Allergies  Allergen Reactions  . Bee Venom     Swelling, short of breath  . Codeine Other (See Comments)    unknown  . Meloxicam Other (See Comments)    GI upset  . Naproxen Other (See Comments)    GI upset   No current facility-administered medications on file prior to encounter.   Current Outpatient Medications on File Prior to Encounter  Medication Sig Dispense Refill  . Ascorbic Acid (VITAMIN C PO) Take 1 tablet by mouth at bedtime.     Marland Kitchen. aspirin 81 MG tablet Take 81 mg by mouth at bedtime.    Marland Kitchen. atorvastatin  (LIPITOR) 40 MG tablet Take 1 tablet (40 mg total) by mouth at bedtime. 30 tablet 6  . esomeprazole (NEXIUM) 40 MG capsule TAKE 1 CAPSULE BY MOUTH EVERY DAY (Patient taking differently: Take 40 mg by mouth at bedtime.) 90 capsule 3  . valACYclovir (VALTREX) 500 MG tablet Take 1 tablet (500 mg total) by mouth daily. (Patient taking differently: Take 500 mg by mouth at bedtime.) 90 tablet 3  . zinc gluconate 50 MG tablet Take 50 mg by mouth at bedtime.      Social History   Socioeconomic History  . Marital status: Married    Spouse name: Not on file  . Number of children: Not on file  . Years of education: Not on file  . Highest education level: Not on file  Occupational History  . Occupation: Tool Maker    Employer: [employer]  Tobacco Use  . Smoking status: Never Smoker  . Smokeless tobacco: Never Used  Substance and Sexual Activity  . Alcohol use: Yes    Alcohol/week: 0.0 standard drinks    Comment: very little  . Drug use: No  . Sexual activity: Yes  Other Topics Concern  . 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 Diabetes Mother   . Heart disease Mother   . Cancer Mother   . Colon cancer Mother        presumed  . Heart disease Father        MI CABG x 3 Pacer  . Diabetes Brother   . Heart disease Brother        MI x 2 Stents  . Prostate cancer Neg Hx   . Rectal cancer Neg Hx   . Stomach cancer Neg Hx     OBJECTIVE:  Vitals:   12/08/20 1054  BP: (!) 164/100   Pulse: 77  Resp: 16  Temp: 98 F (36.7 C)  SpO2: 98%     General appearance: alert; appears fatigued, but nontoxic; speaking in full sentences and tolerating own secretions HEENT: NCAT; Ears: EACs clear, TMs pearly gray; Eyes: PERRL.  EOM grossly intact. Sinuses: nontender; Nose: nares patent without rhinorrhea, Throat: oropharynx clear, tonsils non erythematous or enlarged, uvula midline  Neck: supple without LAD Lungs: unlabored respirations, symmetrical air entry; cough: moderate; no respiratory distress; CTAB Heart: regular rate and rhythm.  Radial pulses 2+ symmetrical bilaterally Skin: warm and dry Psychological: alert and cooperative; normal mood and affect  LABS:  No results found for this or any previous visit (from the past 24 hour(s)).   ASSESSMENT & PLAN:  1. COVID-19 virus infection   2. Shortness of breath with exposure to COVID-19 virus     Meds ordered this encounter  Medications  . fluticasone (FLONASE) 50 MCG/ACT nasal spray    Sig: Place 1 spray into both nostrils daily for 14 days.    Dispense:  16 g    Refill:  0  . cetirizine (ZYRTEC ALLERGY) 10 MG tablet    Sig: Take 1 tablet (10 mg total) by mouth daily.    Dispense:  30 tablet    Refill:  0  . dexamethasone (DECADRON) 4 MG tablet    Sig: Take 1 tablet (4 mg total) by mouth daily for 7 days.    Dispense:  7 tablet    Refill:  0  . albuterol (VENTOLIN HFA) 108 (90 Base) MCG/ACT inhaler    Sig: Inhale 1-2 puffs into the lungs every 6 (six) hours as needed for wheezing or shortness of breath.    Dispense:  18 g    Refill:  0  . benzonatate (TESSALON) 100 MG capsule    Sig: Take 1 capsule (100 mg total) by mouth 3 (three) times daily as needed for cough.    Dispense:  30 capsule    Refill:  0    Discharge instructions  You should remain isolated in your home for 10 days from symptom onset AND greater than 24  hours after symptoms resolution (absence of fever without the use of fever-reducing  medication and improvement in respiratory symptoms), whichever is longer Get plenty of rest and push fluids Tessalon Perles prescribed for cough zyrtec for nasal congestion, runny nose, and/or sore throat  flonase for nasal congestion and runny nose ProAir prescribed  for shortness of breath  Decadron was prescribed use medications daily for symptom relief Use OTC medications like ibuprofen or tylenol as needed fever or pain Call or go to the ED if you have any new or worsening symptoms such as fever, worsening cough, shortness of breath, chest tightness, chest pain, turning blue, changes in mental status, etc...   Reviewed expectations re: course of current medical issues. Questions answered. Outlined signs and symptoms indicating need for more acute intervention. Patient verbalized understanding. After Visit Summary given.         Emerson Monte, [place] 12/08/20 1157

## 2020-12-08 NOTE — Discharge Instructions (Addendum)
You should remain isolated in your home for 10 days from symptom onset AND greater than 24  hours after symptoms resolution (absence of fever without the use of fever-reducing medication and improvement in respiratory symptoms), whichever is longer Get plenty of rest and push fluids Tessalon Perles prescribed for cough zyrtec for nasal congestion, runny nose, and/or sore throat  flonase for nasal congestion and runny nose ProAir prescribed for shortness of breath  Decadron was prescribed use medications daily for symptom relief Use OTC medications like ibuprofen or tylenol as needed fever or pain Call or go to the ED if you have any new or worsening symptoms such as fever, worsening cough, shortness of breath, chest tightness, chest pain, turning blue, changes in mental status, etc..

## 2020-12-14 ENCOUNTER — Encounter: Payer: Self-pay | Admitting: Family Medicine

## 2020-12-14 MED ORDER — BENZONATATE 100 MG PO CAPS: 100.0000 mg | ORAL_CAPSULE | Freq: Three times a day (TID) | ORAL | 0 refills | Status: DC | PRN

## 2020-12-14 NOTE — Telephone Encounter (Signed)
I spoke with pts wife; requesting refills on  1) Dexamethasone 4 mg taking 1 tabdaily for 7 days. Pt given # 7 0n 12/08/20 by Nena Jordan FNP at [facility] in [place]. 2) benzonatate 100 mg taking one cap tid prn for cough and was given # 30 on 12/08/20.  pts wife said spoke with [facility] Dept today and was cleared to return to work on 12/15/20. Pt has not been retested for covid. pts wife said pt still has dark mucus when blows nose,has runny nose,prod cough and not sure of color of phlegm,S/T, SOB upon exertion, burning in chest and loss of smell continues. No fever or chills. pts wife wants to know if Dr Para March thinks pt should return to work with these symptoms/ pt was quarantining from 12/04/20 with first covid symptoms to 12/14/20. Mrs Hauser request cb after reviewed by Dr Para March. Sending note to Dr Para March who is out of office and Dr Reece Agar who is in office.

## 2020-12-14 NOTE — Telephone Encounter (Signed)
I've refilled tessalon perls.  I don't recommend dexamethasone steroid refill without further evaluation.  Where does Erik Mitchell work?  If ongoing productive cough and dyspnea on exertion, I would recommend staying out of work at this time, would recommend virtual visit for evaluation prior to return to work if ongoing symptoms.

## 2020-12-14 NOTE — Telephone Encounter (Signed)
I routed this message to multiple people in clinic today.    I need specifics about his situation.  Please see what med they are requesting, his current status, and see if he has been tested in the meantime.  Thanks.

## 2020-12-16 NOTE — Telephone Encounter (Signed)
Called and left voicemail for patient to return call if he has not received MyChart message.

## 2020-12-20 ENCOUNTER — Telehealth (INDEPENDENT_AMBULATORY_CARE_PROVIDER_SITE_OTHER): Payer: Self-pay | Admitting: Family Medicine

## 2020-12-20 DIAGNOSIS — I10 Essential (primary) hypertension: Secondary | ICD-10-CM

## 2020-12-20 MED ORDER — NITROGLYCERIN 0.4 MG SL SUBL: 0.4000 mg | SUBLINGUAL_TABLET | SUBLINGUAL | 3 refills | Status: DC | PRN

## 2020-12-20 MED ORDER — AMLODIPINE BESYLATE 5 MG PO TABS
2.5000 mg | ORAL_TABLET | Freq: Every day | ORAL | 3 refills | Status: DC
Start: 2020-12-20 — End: 2021-01-27

## 2020-12-20 NOTE — Progress Notes (Signed)
Virtual visit completed through WebEx or similar program Patient location: home  Provider location: [place] at [facility], office  Participants: Patient and me (unless stated otherwise below)  Pandemic considerations d/w pt.   Limitations and rationale for visit method d/w patient.  Patient agreed to proceed.   CC: COVID/HTN  HPI: runny nose and congestion, fatigue, aches.  Sx better than prev.  Still w/o sense of taste and smell.    BP elevated in the meantime.  Not on BP meds.  Only noted to have HTN since covid dx.    He has had dull chest discomfort intermittently for weeks.  He had cath w/o need for stent.  Not SOB except as expected with exertion since covid illness but that isn't getting better yet.   BLE edema.    He thought GERD was controlled, no dysphagia.  Chest discomfort can last up to 30 min. No NTG use.    Meds and allergies reviewed.   ROS: Per HPI unless specifically indicated in ROS section   NAD Speech wnl  A/P: Recent Covid diagnosis.  His congestion and aches are better in the meantime but he still has chest discomfort.  He has not had to use any nitroglycerin.  He has had blood pressure elevation noted since Covid diagnosis.  We talked about options.  At this point still okay for outpatient follow-up.  He had previous catheterization done.  I will update cardiology. In the meantime will add on amlodipine 2.5mg  for 5 days then 5mg  if needed.  And use prn NTG and have him update me as needed.  He agrees with plan.

## 2020-12-23 NOTE — Assessment & Plan Note (Signed)
Recent Covid diagnosis.  His congestion and aches are better in the meantime but he still has chest discomfort.  He has not had to use any nitroglycerin.  He has had blood pressure elevation noted since Covid diagnosis.  We talked about options.  At this point still okay for outpatient follow-up.  He had previous catheterization done.  I will update cardiology. In the meantime will add on amlodipine 2.5mg  for 5 days then 5mg  if needed.  And use prn NTG and have him update me as needed.  He agrees with plan.

## 2020-12-28 ENCOUNTER — Other Ambulatory Visit: Payer: Self-pay

## 2020-12-28 ENCOUNTER — Encounter: Payer: Self-pay | Admitting: Cardiology

## 2020-12-28 ENCOUNTER — Ambulatory Visit (INDEPENDENT_AMBULATORY_CARE_PROVIDER_SITE_OTHER): Payer: Self-pay | Admitting: Cardiology

## 2020-12-28 VITALS — BP 122/80 | HR 79 | Ht 68.0 in | Wt 244.8 lb

## 2020-12-28 DIAGNOSIS — I10 Essential (primary) hypertension: Secondary | ICD-10-CM

## 2020-12-28 DIAGNOSIS — Z8249 Family history of ischemic heart disease and other diseases of the circulatory system: Secondary | ICD-10-CM | POA: Insufficient documentation

## 2020-12-28 DIAGNOSIS — R0602 Shortness of breath: Secondary | ICD-10-CM

## 2020-12-28 DIAGNOSIS — I77819 Aortic ectasia, unspecified site: Secondary | ICD-10-CM

## 2020-12-28 DIAGNOSIS — R079 Chest pain, unspecified: Secondary | ICD-10-CM

## 2020-12-28 DIAGNOSIS — U071 COVID-19: Secondary | ICD-10-CM | POA: Insufficient documentation

## 2020-12-28 NOTE — Assessment & Plan Note (Signed)
Father and brother with premature CAD

## 2020-12-28 NOTE — Progress Notes (Signed)
Cardiology Office Note:    Date:  12/28/2020   ID:  Erik Mitchell, DOB December 21, 1960, MRN [id number]  PCP:  Tonia Ghent, MD  Cardiologist:  Pixie Casino, MD  Electrophysiologist:  None   Referring MD: Tonia Ghent, MD   CC: chest pain, DOE  History of Present Illness:    Erik Mitchell is a 60 y.o. male with a hx of hypertension. He has a family history of coronary disease, his father had CABG in his forties and his brother died of a heart attack. Patient was evaluated for chest pain. He had a negative treadmill in February 2021. Because of continued chest pain he underwent a coronary CTA November 16, 2019. 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 He underwent diagnostic catheterization 11/23/2020 which showed only a mid LAD narrowing of 25%. He had normal LV function and a normal LVEDP. He was contacted for virtual follow-up 12/03/2020. He then presented to the emergency room 12/08/2020 with complaints of cough. He ruled in for COVID. No other testing was done in the emergency room at that visit. His illness was mild and he was discharged home. He presents to the office today for follow-up. He says he still has intermittent discomfort across his chest. He has also had some shortness of breath with exertion which he is noted at work. 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 He denies any hemoptysis. His symptoms may be exacerbated by laying down, he was not 100% positive of this.  Past Medical History:  Diagnosis Date  . Allergic rhinitis 02/16/2003  . Allergy   . Anxiety   . GERD (gastroesophageal reflux disease) 12/18/2002  . H/O cold sores    valtrex suppression  . History of ETT 11/96   wnl, cardiology consult0 [facility] 01/97  . Hyperlipemia 08/19/1995  . Hypertension 08/19/95  . Shingles     Past Surgical History:  Procedure Laterality Date  . LEFT HEART CATH AND CORONARY ANGIOGRAPHY N/A 11/23/2020   Procedure: LEFT HEART CATH AND CORONARY ANGIOGRAPHY;  Surgeon: Martinique,  Peter M, MD;  Location: [place] [facility];  Service: Cardiovascular;  Laterality: N/A;  . TENDON REPAIR     R 4th finger  . TONSILLECTOMY      Current Medications: Current Meds  Medication Sig  . albuterol (VENTOLIN HFA) 108 (90 Base) MCG/ACT inhaler Inhale 1-2 puffs into the lungs every 6 (six) hours as needed for wheezing or shortness of breath.  Marland Kitchen amLODipine (NORVASC) 5 MG tablet Take 0.5-1 tablets (2.5-5 mg total) by mouth daily.  . Ascorbic Acid (VITAMIN C PO) Take 1 tablet by mouth at bedtime.   Marland Kitchen aspirin 81 MG tablet Take 81 mg by mouth at bedtime.  Marland Kitchen atorvastatin (LIPITOR) 40 MG tablet Take 1 tablet (40 mg total) by mouth at bedtime.  . benzonatate (TESSALON) 100 MG capsule Take 1 capsule (100 mg total) by mouth 3 (three) times daily as needed for cough.  . cetirizine (ZYRTEC ALLERGY) 10 MG tablet Take 1 tablet (10 mg total) by mouth daily.  Marland Kitchen esomeprazole (NEXIUM) 40 MG capsule TAKE 1 CAPSULE BY MOUTH EVERY DAY (Patient taking differently: Take 40 mg by mouth at bedtime.)  . fluticasone (FLONASE) 50 MCG/ACT nasal spray Place 1 spray into both nostrils daily for 14 days.  . nitroGLYCERIN (NITROSTAT) 0.4 MG SL tablet Place 1 tablet (0.4 mg total) under the tongue every 5 (five) minutes as needed for chest pain (max 3 doses).  . valACYclovir (VALTREX) 500 MG  tablet Take 1 tablet (500 mg total) by mouth daily. (Patient taking differently: Take 500 mg by mouth at bedtime.)  . zinc gluconate 50 MG tablet Take 50 mg by mouth at bedtime.      Allergies:   Bee venom, Codeine, Meloxicam, and Naproxen   Social History   Socioeconomic History  . Marital status: Married    Spouse name: Not on file  . Number of children: Not on file  . Years of education: Not on file  . Highest education level: Not on file  Occupational History  . Occupation: Tool Maker    Employer: [employer]  Tobacco Use  . Smoking status: Never Smoker  . Smokeless tobacco: Never Used  Substance and  Sexual Activity  . Alcohol use: Yes    Alcohol/week: 0.0 standard drinks    Comment: very little  . Drug use: No  . Sexual activity: Yes  Other Topics Concern  . Not on file  Social History Narrative   Divorced; remarried in 2004; 1 stepdaughter   He has prev laid off from [employer] after 23 years   Working as bus garage for [employer]   [employer] fan   Social Determinants of Radio broadcast assistant Strain: Not on file  Food Insecurity: Not on file  Transportation Needs: Not on file  Physical Activity: Not on file  Stress: Not on file  Social Connections: Not on file     Family History: The patient's family history includes Cancer in his mother; Colon cancer in his mother; Diabetes in his brother and mother; Heart disease in his brother, father, and mother. There is no history of Prostate cancer, Rectal cancer, or Stomach cancer.  ROS:   Please see the history of present illness.     All other systems reviewed and are negative.  EKGs/Labs/Other Studies Reviewed:    The following studies were reviewed today: Cath 11/23/2020-  EKG:  EKG is ordered today.  The ekg ordered today demonstrates NSR, HR 78  Recent Labs: 01/12/2020: ALT 14 11/19/2020: BUN 21; Creatinine, Ser 1.05; Hemoglobin 15.1; Platelets 218; Potassium 4.2; Sodium 144  Recent Lipid Panel    Component Value Date/Time   CHOL 160 01/12/2020 1526   TRIG 84.0 01/12/2020 1526   HDL 34.80 (L) 01/12/2020 1526   CHOLHDL 5 01/12/2020 1526   VLDL 16.8 01/12/2020 1526   LDLCALC 108 (H) 01/12/2020 1526   LDLCALC 157 (H) 10/04/2018 1550   LDLDIRECT 84.0 01/07/2019 1652    Physical Exam:    VS:  BP 122/80   Pulse 79   Ht 5\' 8"  (1.727 m)   Wt 244 lb 12.8 oz (111 kg)   SpO2 97%   BMI 37.22 kg/m     Wt Readings from Last 3 Encounters:  12/28/20 244 lb 12.8 oz (111 kg)  12/20/20 228 lb (103.4 kg)  12/03/20 228 lb (103.4 kg)     GEN: Overweight male, well developed in no acute  distress HEENT: Normal NECK: No JVD; No carotid bruits CARDIAC: RRR, no murmurs, rubs, gallops RESPIRATORY:  Clear to auscultation without rales, wheezing or rhonchi  ABDOMEN: Soft, non-tender, non-distended MUSCULOSKELETAL:  No edema; No deformity  SKIN: Warm and dry NEUROLOGIC:  Alert and oriented x 3 PSYCHIATRIC:  Normal affect   ASSESSMENT:    Chest pain of uncertain etiology Cath 11/23/2020- minor mLAD disease-25% Normal LVF, normal LVEDP. Check echo to r/o pericarditis  COVID-19 Seen in ED 12/08/2020-+ for COVID  Essential hypertension Controlled  Family history of coronary artery disease Father and brother with premature CAD  Aortic dilatation (HCC) 4.3 cm  HLD (hyperlipidemia) On statin rx  PLAN:    Check echo-if this is normal I think we can reassure Mr. Sermon with a high degree of certainty that his chest discomfort is not secondary to a cardiac issue. F/u OV with Dr Debara Pickett.   Medication Adjustments/Labs and Tests Ordered: Current medicines are reviewed at length with the patient today.  Concerns regarding medicines are outlined above.  Orders Placed This Encounter  Procedures  . EKG 12-Lead  . ECHOCARDIOGRAM COMPLETE   No orders of the defined types were placed in this encounter.   Patient Instructions  Medication Instructions:  Continue current medication  *If you need a refill on your cardiac medications before your next appointment, please call your pharmacy*   Lab Work: None Ordered  Testing/Procedures: Your physician has requested that you have an echocardiogram. Echocardiography is a painless test that uses sound waves to create images of your heart. It provides your doctor with information about the size and shape of your heart and how well your heart's chambers and valves are working. This procedure takes approximately one hour. There are no restrictions for this procedure.   Follow-Up: At [facility], you and your health needs are  our priority.  As part of our continuing mission to provide you with exceptional heart care, we have created designated Provider Care Teams.  These Care Teams include your primary Cardiologist (physician) and Advanced Practice Providers (APPs -  Physician Assistants and Nurse Practitioners) who all work together to provide you with the care you need, when you need it.  We recommend signing up for the patient portal called "MyChart".  Sign up information is provided on this After Visit Summary.  MyChart is used to connect with patients for Virtual Visits (Telemedicine).  Patients are able to view lab/test results, encounter notes, upcoming appointments, etc.  Non-urgent messages can be sent to your provider as well.   To learn more about what you can do with MyChart, go to [email or web address].    Your next appointment:   6 month(s)  The format for your next appointment:   In Person  Provider:   You may see Pixie Casino, MD or one of the following Advanced Practice Providers on your designated Care Team:    Almyra Deforest, PA-C  Fabian Sharp, PA-C or   Roby Lofts, PA-C        Signed, Kerin Ransom, [place]  12/28/2020 4:12 PM    [facility]

## 2020-12-28 NOTE — Assessment & Plan Note (Signed)
Cath 11/23/2020- minor mLAD disease-25% Normal LVF, normal LVEDP. Check echo to r/o pericarditis

## 2020-12-28 NOTE — Patient Instructions (Signed)
Medication Instructions:  Continue current medication  *If you need a refill on your cardiac medications before your next appointment, please call your pharmacy*   Lab Work: None Ordered  Testing/Procedures: Your physician has requested that you have an echocardiogram. Echocardiography is a painless test that uses sound waves to create images of your heart. It provides your doctor with information about the size and shape of your heart and how well your heart's chambers and valves are working. This procedure takes approximately one hour. There are no restrictions for this procedure.   Follow-Up: At [facility], you and your health needs are our priority.  As part of our continuing mission to provide you with exceptional heart care, we have created designated Provider Care Teams.  These Care Teams include your primary Cardiologist (physician) and Advanced Practice Providers (APPs -  Physician Assistants and Nurse Practitioners) who all work together to provide you with the care you need, when you need it.  We recommend signing up for the patient portal called "MyChart".  Sign up information is provided on this After Visit Summary.  MyChart is used to connect with patients for Virtual Visits (Telemedicine).  Patients are able to view lab/test results, encounter notes, upcoming appointments, etc.  Non-urgent messages can be sent to your provider as well.   To learn more about what you can do with MyChart, go to [email or web address].    Your next appointment:   6 month(s)  The format for your next appointment:   In Person  Provider:   You may see Pixie Casino, MD or one of the following Advanced Practice Providers on your designated Care Team:    Almyra Deforest, PA-C  Fabian Sharp, PA-C or   Roby Lofts, [place]

## 2020-12-28 NOTE — Assessment & Plan Note (Signed)
4.3 cm

## 2020-12-28 NOTE — Assessment & Plan Note (Signed)
Seen in ED 12/08/2020-+ for COVID

## 2020-12-28 NOTE — Assessment & Plan Note (Signed)
Controlled.  

## 2020-12-28 NOTE — Assessment & Plan Note (Signed)
On statin rx 

## 2020-12-30 ENCOUNTER — Ambulatory Visit: Payer: BC Managed Care – PPO | Admitting: Internal Medicine

## 2021-01-19 ENCOUNTER — Telehealth (HOSPITAL_COMMUNITY): Payer: Self-pay | Admitting: Internal Medicine

## 2021-01-19 NOTE — Telephone Encounter (Signed)
Patient called and cancelled echocardiogram for 01/21/21 and did nt wish to reschedule at this time due to issues at work. Order will be removed from the [facility] and if patient calls back to reschedule we will reinstate the order.

## 2021-01-19 NOTE — Telephone Encounter (Signed)
Routed to Primary MD to make aware.

## 2021-01-21 ENCOUNTER — Other Ambulatory Visit (HOSPITAL_COMMUNITY): Payer: Self-pay

## 2021-01-21 ENCOUNTER — Other Ambulatory Visit: Payer: Self-pay | Admitting: Family Medicine

## 2021-01-21 ENCOUNTER — Encounter: Payer: Self-pay | Admitting: Family Medicine

## 2021-01-24 ENCOUNTER — Other Ambulatory Visit: Payer: Self-pay | Admitting: Family Medicine

## 2021-01-27 ENCOUNTER — Other Ambulatory Visit: Payer: Self-pay | Admitting: Family Medicine

## 2021-01-27 MED ORDER — AMLODIPINE BESYLATE 5 MG PO TABS: 5.0000 mg | ORAL_TABLET | Freq: Every day | ORAL | Status: DC

## 2021-02-24 ENCOUNTER — Encounter: Payer: Self-pay | Admitting: Family Medicine

## 2021-02-25 ENCOUNTER — Other Ambulatory Visit: Payer: Self-pay

## 2021-02-25 MED ORDER — FLUTICASONE PROPIONATE 50 MCG/ACT NA SUSP: 1.0000 | Freq: Every day | NASAL | 0 refills | Status: DC

## 2021-02-25 MED ORDER — CETIRIZINE HCL 10 MG PO TABS: 10.0000 mg | ORAL_TABLET | Freq: Every day | ORAL | 0 refills | Status: DC

## 2021-03-20 ENCOUNTER — Other Ambulatory Visit: Payer: Self-pay | Admitting: Family Medicine

## 2021-03-24 ENCOUNTER — Telehealth: Payer: Self-pay | Admitting: *Deleted

## 2021-03-24 DIAGNOSIS — Z006 Encounter for examination for normal comparison and control in clinical research program: Secondary | ICD-10-CM

## 2021-03-24 NOTE — Telephone Encounter (Signed)
I called patient for 90-day Identify Study phone call. I left message for patient to call me back or respond to my e-mail.

## 2021-04-11 ENCOUNTER — Other Ambulatory Visit: Payer: Self-pay | Admitting: General Practice

## 2021-04-11 ENCOUNTER — Other Ambulatory Visit: Payer: Self-pay | Admitting: Family Medicine

## 2021-04-19 ENCOUNTER — Other Ambulatory Visit: Payer: Self-pay | Admitting: Family Medicine

## 2021-04-21 ENCOUNTER — Telehealth: Payer: Self-pay | Admitting: Cardiology

## 2021-04-21 DIAGNOSIS — Z006 Encounter for examination for normal comparison and control in clinical research program: Secondary | ICD-10-CM

## 2021-04-21 NOTE — Telephone Encounter (Signed)
Called patient for 90 day Identify phone call no answer, I left a voicemail stating the intent of the phone call and our call back number to be reached in our department. 

## 2021-04-27 ENCOUNTER — Telehealth: Payer: Self-pay

## 2021-04-27 DIAGNOSIS — Z006 Encounter for examination for normal comparison and control in clinical research program: Secondary | ICD-10-CM

## 2021-04-27 NOTE — Telephone Encounter (Signed)
Called patient for 90 day Identify phone call no answer, I left a voicemail stating the intent of the phone call and our call back number to be reached in our department. 

## 2021-05-17 ENCOUNTER — Telehealth: Payer: Self-pay

## 2021-05-17 DIAGNOSIS — Z006 Encounter for examination for normal comparison and control in clinical research program: Secondary | ICD-10-CM

## 2021-05-17 NOTE — Telephone Encounter (Signed)
I have attempted without success to contact this patient by phone for his Identify 90 day follow up phone call. I left a message for patient to return my phone call with my name and callback number. An e-mail was also sent to patient.   

## 2021-06-14 ENCOUNTER — Other Ambulatory Visit: Payer: Self-pay | Admitting: General Practice

## 2021-06-14 ENCOUNTER — Other Ambulatory Visit: Payer: Self-pay | Admitting: Family Medicine

## 2021-09-18 ENCOUNTER — Other Ambulatory Visit: Payer: Self-pay | Admitting: General Practice

## 2021-10-03 ENCOUNTER — Telehealth (HOSPITAL_COMMUNITY): Payer: Self-pay | Admitting: General Practice

## 2021-10-03 NOTE — Telephone Encounter (Signed)
I called patient to schedule echocardiogram and patient did not wish to schedule. He will call the cardiologist office if he has any questions. Order will be removed from the Echo WQ.

## 2021-12-20 ENCOUNTER — Other Ambulatory Visit: Payer: Self-pay | Admitting: Family Medicine

## 2021-12-20 ENCOUNTER — Other Ambulatory Visit: Payer: Self-pay | Admitting: Internal Medicine

## 2021-12-23 ENCOUNTER — Encounter: Payer: Self-pay | Admitting: Family Medicine

## 2021-12-23 NOTE — Telephone Encounter (Signed)
Sent. Thanks.  Please schedule yearly visit.

## 2021-12-23 NOTE — Telephone Encounter (Signed)
Please call and scheduled yearly visit.

## 2021-12-24 ENCOUNTER — Other Ambulatory Visit: Payer: Self-pay | Admitting: Family Medicine

## 2021-12-26 ENCOUNTER — Encounter: Payer: Self-pay | Admitting: Family Medicine

## 2021-12-26 NOTE — Telephone Encounter (Signed)
Sent. Thanks.   

## 2021-12-26 NOTE — Telephone Encounter (Signed)
Refill request for cetirizine 10 mg tablets  LOV - 12/20/20 Next OV - not scheduled; messages and mychart messages have been left for patient to schedule appt.  Last refill - 06/15/21 #90/1

## 2021-12-26 NOTE — Telephone Encounter (Signed)
LMTCB to schedule CPE, mychart sent

## 2022-03-18 ENCOUNTER — Other Ambulatory Visit: Payer: Self-pay | Admitting: Internal Medicine

## 2022-03-29 ENCOUNTER — Other Ambulatory Visit: Payer: Self-pay | Admitting: Internal Medicine

## 2022-06-16 ENCOUNTER — Other Ambulatory Visit: Payer: Self-pay | Admitting: Family Medicine

## 2022-07-02 ENCOUNTER — Other Ambulatory Visit: Payer: Self-pay | Admitting: Family Medicine

## 2022-07-02 DIAGNOSIS — I10 Essential (primary) hypertension: Secondary | ICD-10-CM

## 2022-07-03 ENCOUNTER — Other Ambulatory Visit (INDEPENDENT_AMBULATORY_CARE_PROVIDER_SITE_OTHER): Payer: BC Managed Care – PPO

## 2022-07-03 ENCOUNTER — Encounter: Payer: Self-pay | Admitting: Family Medicine

## 2022-07-03 ENCOUNTER — Telehealth: Payer: Self-pay | Admitting: Internal Medicine

## 2022-07-03 DIAGNOSIS — I10 Essential (primary) hypertension: Secondary | ICD-10-CM | POA: Diagnosis not present

## 2022-07-03 LAB — LIPID PANEL
Cholesterol: 192 mg/dL (ref 0–200)
HDL: 28.7 mg/dL — ABNORMAL LOW (ref >39.00)
LDL Cholesterol: 127 mg/dL — ABNORMAL HIGH (ref 0–99)
NonHDL: 163.28
Total CHOL/HDL Ratio: 7
Triglycerides: 183 mg/dL — ABNORMAL HIGH (ref 0.0–149.0)
VLDL: 36.6 mg/dL (ref 0.0–40.0)

## 2022-07-03 LAB — COMPREHENSIVE METABOLIC PANEL
ALT: 13 U/L (ref 0–53)
AST: 14 U/L (ref 0–37)
Albumin: 4.5 g/dL (ref 3.5–5.2)
Alkaline Phosphatase: 85 U/L (ref 39–117)
BUN: 18 mg/dL (ref 6–23)
CO2: 27 mEq/L (ref 19–32)
Calcium: 9.5 mg/dL (ref 8.4–10.5)
Chloride: 105 mEq/L (ref 96–112)
Creatinine, Ser: 1.02 mg/dL (ref 0.40–1.50)
GFR: 80.01 mL/min (ref >60.00)
Glucose, Bld: 117 mg/dL — ABNORMAL HIGH (ref 70–99)
Potassium: 3.9 mEq/L (ref 3.5–5.1)
Sodium: 142 mEq/L (ref 135–145)
Total Bilirubin: 0.3 mg/dL (ref 0.2–1.2)
Total Protein: 6.8 g/dL (ref 6.0–8.3)

## 2022-07-03 NOTE — Telephone Encounter (Signed)
*  STAT* If patient is at the pharmacy, call can be transferred to refill team.   1. Which medications need to be refilled? (please list name of each medication and dose if known) atorvastatin (LIPITOR) 40 MG tablet  2. Which pharmacy/location (including street and city if local pharmacy) is medication to be sent to?[id number] [place], [place] - [address]  3. Do they need a 30 day or 90 day supply? 90    Pt made an appt 11/02/22 w/ Dr. HDebara Pickett

## 2022-07-03 NOTE — Telephone Encounter (Signed)
Okay to send refill. 

## 2022-07-04 MED ORDER — VALACYCLOVIR HCL 500 MG PO TABS: 500.0000 mg | ORAL_TABLET | Freq: Every day | ORAL | 3 refills | Status: DC

## 2022-07-07 ENCOUNTER — Ambulatory Visit (INDEPENDENT_AMBULATORY_CARE_PROVIDER_SITE_OTHER): Payer: BC Managed Care – PPO | Admitting: Family Medicine

## 2022-07-07 ENCOUNTER — Encounter: Payer: Self-pay | Admitting: Family Medicine

## 2022-07-07 VITALS — BP 134/92 | HR 68 | Temp 98.0°F | Ht 68.0 in | Wt 241.0 lb

## 2022-07-07 DIAGNOSIS — Z7189 Other specified counseling: Secondary | ICD-10-CM

## 2022-07-07 DIAGNOSIS — B009 Herpesviral infection, unspecified: Secondary | ICD-10-CM

## 2022-07-07 DIAGNOSIS — Z0000 Encounter for general adult medical examination without abnormal findings: Secondary | ICD-10-CM

## 2022-07-07 DIAGNOSIS — H5789 Other specified disorders of eye and adnexa: Secondary | ICD-10-CM

## 2022-07-07 DIAGNOSIS — Z1211 Encounter for screening for malignant neoplasm of colon: Secondary | ICD-10-CM

## 2022-07-07 DIAGNOSIS — E785 Hyperlipidemia, unspecified: Secondary | ICD-10-CM

## 2022-07-07 DIAGNOSIS — I10 Essential (primary) hypertension: Secondary | ICD-10-CM

## 2022-07-07 MED ORDER — POLYMYXIN B-TRIMETHOPRIM 10000-0.1 UNIT/ML-% OP SOLN: 1.0000 [drp] | Freq: Four times a day (QID) | OPHTHALMIC | 0 refills | Status: DC

## 2022-07-07 MED ORDER — ESOMEPRAZOLE MAGNESIUM 40 MG PO CPDR: DELAYED_RELEASE_CAPSULE | ORAL | 3 refills | Status: DC

## 2022-07-07 MED ORDER — AMLODIPINE BESYLATE 5 MG PO TABS: ORAL_TABLET | ORAL | Status: DC

## 2022-07-07 MED ORDER — ATORVASTATIN CALCIUM 40 MG PO TABS: ORAL_TABLET | ORAL | 3 refills | Status: DC

## 2022-07-07 MED ORDER — AMLODIPINE BESYLATE 5 MG PO TABS: ORAL_TABLET | ORAL | 3 refills | Status: DC

## 2022-07-07 MED ORDER — NITROGLYCERIN 0.4 MG SL SUBL: SUBLINGUAL_TABLET | SUBLINGUAL | 1 refills | Status: AC

## 2022-07-07 NOTE — Progress Notes (Unsigned)
CPE- See plan.  Routine anticipatory guidance given to patient.  See health maintenance.  The possibility exists that previously documented standard health maintenance information may have been brought forward from a previous encounter into this note.  If needed, that same information has been updated to reflect the current situation based on today's encounter.    Tetanus 2015 Flu d/w pt.   PNA 2009 Shingles d/w pt.   covid vaccine d/w pt.  Colon cancer screening d/w pt.  referred 2023.   Prostate cancer screening and PSA options (with potential risks and benefits of testing vs not testing) were discussed along with recent recs/guidelines.  He declined testing PSA at this point. Living will d/w pt.  Wife designated if patient were incapacitated.   Diet and exercise d/w pt. Encouraged both.  HCV neg.  D/w pt.   HIV prev neg in ~1996 per patient report.   Hypertension:    Using medication without problems or lightheadedness: yes Chest pain with exertion: he has SOB on exertion then a burning in the chest with exertion.  Not at rest.  No NTG use.  Sx noted in the last 6 months or so, worse than prior baseline.   Edema:no Short of breath: since he had covid.    Father in law living with patient, he has Lewy Body dementia.    Elevated Cholesterol: Using medications without problems: yes Muscle aches: no Diet compliance: d/w pt.   Exercise: d/w pt.   Lipids were collected off statin.    D/w pt about suppression with valtex, it helps.  No ADE on med.    He has CTS surgery pending, bilateral.    Irritation inferior to lower L eyelid.  L conjunctiva minimally irritated.  No known pinkeye.  Some matting, no vision change.    Normal eye exam w/o lesion seen on staining.    PMH and SH reviewed  Meds, vitals, and allergies reviewed.   ROS: Per HPI.  Unless specifically indicated otherwise in HPI, the patient denies:  General: fever. Eyes: acute vision changes ENT: sore  throat Cardiovascular: chest pain Respiratory: SOB GI: vomiting GU: dysuria Musculoskeletal: acute back pain Derm: acute rash Neuro: acute motor dysfunction Psych: worsening mood Endocrine: polydipsia Heme: bleeding Allergy: hayfever  GEN: nad, alert and oriented HEENT: mucous membranes moist NECK: supple w/o LA CV: rrr. PULM: ctab, no inc wob ABD: soft, +bs EXT: no edema SKIN: no acute rash B patches of varicose veins on the upper legs.   He has mild left-sided conjunctival injection.  Not noted on the right.  Mild irritation inferior to the left lower eyelid.  Pupils equally round and reactive to light bilaterally and extraocular movements intact bilaterally.  Woods lamp exam without any epithelial disruption seen on the left eye.  No foreign body seen.

## 2022-07-07 NOTE — Patient Instructions (Addendum)
Check with your insurance to see if they will cover the shingles shot.  It may be cheaper at the pharmacy.   Take care.  Glad to see you. Call the heart clinic if they don't call you first.  We'll call about seeing GI- they should call you.    If you have any eye matting tomorrow AM then start the eyedrops for presumed pinkeye.

## 2022-07-09 DIAGNOSIS — H5789 Other specified disorders of eye and adnexa: Secondary | ICD-10-CM | POA: Insufficient documentation

## 2022-07-09 NOTE — Assessment & Plan Note (Signed)
Discussed starting Polytrim if he has matting of the eye tomorrow for presumed pinkeye.  Otherwise okay for outpatient follow-up.  No vision loss and unilateral symptoms without foreign body seen.

## 2022-07-09 NOTE — Assessment & Plan Note (Signed)
Tetanus 2015 Flu d/w pt.   PNA 2009 Shingles d/w pt.   covid vaccine d/w pt.  Colon cancer screening d/w pt.  referred 2023.   Prostate cancer screening and PSA options (with potential risks and benefits of testing vs not testing) were discussed along with recent recs/guidelines.  He declined testing PSA at this point. Living will d/w pt.  Wife designated if patient were incapacitated.   Diet and exercise d/w pt. Encouraged both.  HCV neg.  D/w pt.   HIV prev neg in ~1996 per patient report.

## 2022-07-09 NOTE — Assessment & Plan Note (Signed)
Continue Valtrex 

## 2022-07-09 NOTE — Assessment & Plan Note (Signed)
Living will d/w pt.  Wife designated if patient were incapacitated.   ?

## 2022-07-09 NOTE — Assessment & Plan Note (Signed)
Continue atorvastatin

## 2022-07-09 NOTE — Assessment & Plan Note (Addendum)
He has noted chest pain with exertion: he has SOB on exertion then a burning in the chest with exertion.  Not at rest.  No NTG use.  Sx noted in the last 6 months or so, worse than prior baseline.   I sent a message to Dr. Debara Pickett about moving up his evaluation and I asked the patient to call to cardiology clinic if he does not hear from them first.  Routine cautions given to patient.  Continue amlodipine.

## 2022-07-14 ENCOUNTER — Telehealth: Payer: Self-pay | Admitting: Internal Medicine

## 2022-07-14 NOTE — Telephone Encounter (Signed)
-----   Message -----  From: Tonia Ghent, MD  Sent: 07/07/2022   2:59 PM EDT  To: Pixie Casino, MD   My office note isn't done yet, here with patient today.  He is having progressive chest burning and SOB on exertion and I need his cardiology OV moved up for eval.     Thanks.       Called patient 3x with no success in scheduling appointment. Will sent mychart message.

## 2022-09-11 ENCOUNTER — Ambulatory Visit: Payer: BC Managed Care – PPO | Admitting: Family Medicine

## 2022-09-11 ENCOUNTER — Encounter: Payer: Self-pay | Admitting: Family Medicine

## 2022-09-11 VITALS — BP 138/80 | HR 59 | Temp 98.4°F | Ht 68.0 in | Wt 237.2 lb

## 2022-09-11 DIAGNOSIS — K4090 Unilateral inguinal hernia, without obstruction or gangrene, not specified as recurrent: Secondary | ICD-10-CM

## 2022-09-11 NOTE — Progress Notes (Unsigned)
Erik Mitchell T. Meryl Hubers, MD, [place] at [facility] [place], [address]  Phone: [phone]  FAX: [phone]  Erik Mitchell - 61 y.o. male  MRN [id number]  Date of Birth: November 06, 1961  Date: 09/11/2022  PCP: Tonia Ghent, MD  Referral: Tonia Ghent, MD  Chief Complaint  Patient presents with   Bulge in [place]   Subjective:   Erik Mitchell is a 61 y.o. very pleasant male patient with Body mass index is 36.07 kg/m. who presents with the following:  Friday - hernia.   On Friday, the patient noticed that he has a bulge in his groin on the right side.  He was not doing anything in particular, but he did notice that he had a pressure and some discomfort in this area and a palpable lump in the groin that it never been there before.  Aside from this, he is feeling perfectly well and has no other complaints.  Review of Systems is noted in the HPI, as appropriate  Objective:   BP 138/80   Pulse (!) 59   Temp 98.4 F (36.9 C) (Oral)   Ht '5\' 8"'$  (1.727 m)   Wt 237 lb 4 oz (107.6 kg)   SpO2 98%   BMI 36.07 kg/m   GEN: No acute distress; alert,appropriate. PULM: Breathing comfortably in no respiratory distress PSYCH: Normally interactive.  GU: Normal male, in the right inguinal canal patient does have a hernia just caudal to the superficial inguinal ring and the testes.  Slightly bulges with Valsalva.  Laboratory and Imaging Data:  Assessment and Plan:     ICD-10-CM   1. Right inguinal hernia  K40.90      Talked about hernias in general with the patient and his wife.  For now, think is reasonable to watch this and see how this affects his day-to-day life and work life.  He does lift quite a bit for work, heavy weights, but I think it is probably too early to go directly or consider operative intervention.  He may be fine with this for years, so he and his family are going to observe.  We did talk about risk of  strangulation, rare.  Dragon [employer] One speech-to-text software was used for transcription in this dictation.  Possible transcriptional errors can occur using Editor, commissioning.   Signed,  Maud Deed. Dymphna Wadley, MD   Outpatient Encounter Medications as of 09/11/2022  Medication Sig   albuterol (VENTOLIN HFA) 108 (90 Base) MCG/ACT inhaler Inhale 1-2 puffs into the lungs every 6 (six) hours as needed for wheezing or shortness of breath.   amLODipine (NORVASC) 5 MG tablet TAKE 1 TABLET BY MOUTH EVERY DAY   Ascorbic Acid (VITAMIN C PO) Take 1 tablet by mouth at bedtime.    aspirin 81 MG tablet Take 81 mg by mouth at bedtime.   atorvastatin (LIPITOR) 40 MG tablet TAKE 1 TABLET BY MOUTH EVERYDAY AT BEDTIME   esomeprazole (NEXIUM) 40 MG capsule TAKE 1 CAPSULE BY MOUTH EVERY DAY   nitroGLYCERIN (NITROSTAT) 0.4 MG SL tablet PLACE 1 TABLET UNDER THE TONGUE EVERY 5 (FIVE) MINUTES AS NEEDED FOR CHEST PAIN (MAX 3 DOSES).   valACYclovir (VALTREX) 500 MG tablet Take 1 tablet (500 mg total) by mouth daily.   zinc gluconate 50 MG tablet Take 50 mg by mouth at bedtime.    [DISCONTINUED] trimethoprim-polymyxin b (POLYTRIM) ophthalmic solution Place 1 drop into the left eye in the morning,  at noon, in the evening, and at bedtime.   No facility-administered encounter medications on file as of 09/11/2022.

## 2022-11-02 ENCOUNTER — Encounter: Payer: Self-pay | Admitting: Internal Medicine

## 2022-11-02 ENCOUNTER — Ambulatory Visit: Payer: BC Managed Care – PPO | Attending: Internal Medicine | Admitting: Internal Medicine

## 2022-11-02 VITALS — BP 133/90 | HR 63 | Ht 68.0 in | Wt 232.4 lb

## 2022-11-02 DIAGNOSIS — E785 Hyperlipidemia, unspecified: Secondary | ICD-10-CM

## 2022-11-02 DIAGNOSIS — I77810 Thoracic aortic ectasia: Secondary | ICD-10-CM

## 2022-11-02 DIAGNOSIS — I2510 Atherosclerotic heart disease of native coronary artery without angina pectoris: Secondary | ICD-10-CM

## 2022-11-02 DIAGNOSIS — R0602 Shortness of breath: Secondary | ICD-10-CM | POA: Diagnosis not present

## 2022-11-02 DIAGNOSIS — I2584 Coronary atherosclerosis due to calcified coronary lesion: Secondary | ICD-10-CM

## 2022-11-02 NOTE — Progress Notes (Signed)
OFFICE NOTE  Chief Complaint:  Follow-up  Primary Care Physician: Tonia Ghent, MD  HPI:  Erik Mitchell is a 61 y.o. male with a past medial history significant for hypertension, dyslipidemia and bradycardia.  [name] is a pleasant 61 year old male previously seen by Dr. Johnsie Cancel for bradycardia.  He has reported some exertional dyspnea and infrequent chest discomfort.  He underwent exercise treadmill stress testing in 1996 and a Myoview stress test in 2003 which showed normal LVEF of 67%.  He reports being able to augment heart rate with exercise.  He had a repeat treadmill stress test in February 2020 which was negative as well.  Heart rate increased to 146 with max exercise.  He does report some nocturnal bradycardia.  His wife is a patient of mine and she wanted him to see me for a second opinion.  More recently an echo in February 2020 revealed an EF of 60 to 65% however the aortic root was mildly dilated at 4 cm.  A CTA was recommended in February 2021, but has not been performed.  Since he last saw Dr. Johnsie Cancel he has had some shortness of breath with exertion and gets some infrequent chest discomfort.  He describes it as sharp or stabbing in the left anterior chest wall and lasts only for a few seconds.  Sometimes with exertion but at other times can come at rest.  Overall seems somewhat atypical.  Heart rate today was 59.  EKG demonstrated sinus bradycardia without ischemic changes.  Normal axes and normal intervals.  11/02/2022  Erik Mitchell is seen today in follow-up.  He is accompanied by his wife.  When I last saw him he was having symptoms concerning for unstable angina.  He had CT coronary angiography which demonstrated a high calcium score of 882, 97th percentile for age and sex matched control.  His aorta was dilated up to 43 mm.  A definitive cardiac catheterization was recommended.  Cath was performed in December 2021 however only showed mild nonobstructive coronary disease with  normal LV function.  Subsequently aggressive medical therapy was recommended.  Today in follow-up he reports that he does get some fatigue and shortness of breath with marked exertion but no chest pain.  Blood pressure is reasonably well controlled.  His cholesterol is not at target with total 192, HDL 28, triglycerides 183 and LDL 127.  This is despite therapy on atorvastatin 40 mg daily.  He also takes aspirin.  PMHx:  Past Medical History:  Diagnosis Date   Allergic rhinitis 02/16/2003   Allergy    Anxiety    GERD (gastroesophageal reflux disease) 12/18/2002   H/O cold sores    valtrex suppression   History of ETT 11/96   wnl, cardiology consult0 [place] 01/97   Hyperlipemia 08/19/1995   Hypertension 08/19/95   Shingles     Past Surgical History:  Procedure Laterality Date   LEFT HEART CATH AND CORONARY ANGIOGRAPHY N/A 11/23/2020   Procedure: LEFT HEART CATH AND CORONARY ANGIOGRAPHY;  Surgeon: Martinique, Peter M, MD;  Location: [facility];  Service: Cardiovascular;  Laterality: N/A;   TENDON REPAIR     R 4th finger   TONSILLECTOMY      FAMHx:  Family History  Problem Relation Age of Onset   Diabetes Mother    Heart disease Mother    Cancer Mother    Colon cancer Mother        presumed   Heart disease Father  MI CABG x 3 Pacer   Diabetes Brother    Heart disease Brother        MI x 2 Stents   Prostate cancer Neg Hx    Rectal cancer Neg Hx    Stomach cancer Neg Hx     SOCHx:   reports that he has never smoked. He has never used smokeless tobacco. He reports current alcohol use. He reports that he does not use drugs.  ALLERGIES:  Allergies  Allergen Reactions   Bee Venom     Swelling, short of breath   Codeine Other (See Comments)    unknown   Meloxicam Other (See Comments)    GI upset   Naproxen Other (See Comments)    GI upset    ROS: Pertinent items noted in HPI and remainder of comprehensive ROS otherwise negative.  HOME MEDS: Current  Outpatient Medications on File Prior to Visit  Medication Sig Dispense Refill   albuterol (VENTOLIN HFA) 108 (90 Base) MCG/ACT inhaler Inhale 1-2 puffs into the lungs every 6 (six) hours as needed for wheezing or shortness of breath. 18 g 0   amLODipine (NORVASC) 5 MG tablet TAKE 1 TABLET BY MOUTH EVERY DAY 90 tablet 3   Ascorbic Acid (VITAMIN C PO) Take 1 tablet by mouth at bedtime.      aspirin 81 MG tablet Take 81 mg by mouth at bedtime.     atorvastatin (LIPITOR) 40 MG tablet TAKE 1 TABLET BY MOUTH EVERYDAY AT BEDTIME 90 tablet 3   esomeprazole (NEXIUM) 40 MG capsule TAKE 1 CAPSULE BY MOUTH EVERY DAY 90 capsule 3   nitroGLYCERIN (NITROSTAT) 0.4 MG SL tablet PLACE 1 TABLET UNDER THE TONGUE EVERY 5 (FIVE) MINUTES AS NEEDED FOR CHEST PAIN (MAX 3 DOSES). 75 tablet 1   valACYclovir (VALTREX) 500 MG tablet Take 1 tablet (500 mg total) by mouth daily. 90 tablet 3   zinc gluconate 50 MG tablet Take 50 mg by mouth at bedtime.      No current facility-administered medications on file prior to visit.    LABS/IMAGING: No results found for this or any previous visit (from the past 48 hour(s)). No results found.  LIPID PANEL:    Component Value Date/Time   CHOL 192 07/03/2022 0731   TRIG 183.0 (H) 07/03/2022 0731   HDL 28.70 (L) 07/03/2022 0731   CHOLHDL 7 07/03/2022 0731   VLDL 36.6 07/03/2022 0731   LDLCALC 127 (H) 07/03/2022 0731   LDLCALC 157 (H) 10/04/2018 1550   LDLDIRECT 84.0 01/07/2019 1652     WEIGHTS: Wt Readings from Last 3 Encounters:  11/02/22 232 lb 6.4 oz (105.4 kg)  09/11/22 237 lb 4 oz (107.6 kg)  07/07/22 241 lb (109.3 kg)    VITALS: BP (!) 133/90 (BP Location: Left Arm, Patient Position: Sitting)   Pulse 63   Ht '5\' 8"'$  (1.727 m)   Wt 232 lb 6.4 oz (105.4 kg)   SpO2 98%   BMI 35.34 kg/m   EXAM: General appearance: alert and no distress Neck: no carotid bruit, no JVD and thyroid not enlarged, symmetric, no tenderness/mass/nodules Lungs: clear to auscultation  bilaterally Heart: regular rate and rhythm Abdomen: soft, non-tender; bowel sounds normal; no masses,  no organomegaly Extremities: extremities normal, atraumatic, no cyanosis or edema Pulses: 2+ and symmetric Skin: Skin color, texture, turgor normal. 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   Erik Mitchell has been having some fatigue and shortness of breath.  His cholesterol is above target.  He has not been able to make any significant dietary changes and feels that he could continue to work on that and some weight loss.  He does not sound like he is having any anginal symptoms.  His aortic root was dilated at 43 mm measured by CT.  This will need to be reassessed.  If he is not able to further lower his LDL to target less than 70, then would recommend likely adding ezetimibe.  We will plan a repeat lipid profile in about 3 months and he could follow-up at that time.  I may consider a CT angiogram of the aorta to reassess his aortic root.  Pixie Casino, MD, [facility], [place] Director of the Advanced Lipid Disorders &  Cardiovascular Risk Reduction Clinic Diplomate of the American Board of Clinical Lipidology Attending Cardiologist  Direct Dial: [phone]  Fax: [phone]  Website:  [email or web address] 11/02/2022, 9:21 PM

## 2022-11-02 NOTE — Patient Instructions (Signed)
Medication Instructions:  NO CHANGES   *If you need a refill on your cardiac medications before your next appointment, please call your pharmacy*   Lab Work: FASTING lab work in 3 months   If you have labs (blood work) drawn today and your tests are completely normal, you will receive your results only by: [employer] (if you have MyChart) OR A paper copy in the mail If you have any lab test that is abnormal or we need to change your treatment, we will call you to review the results.   Testing/Procedures: NONE   Follow-Up: At [facility], you and your health needs are our priority.  As part of our continuing mission to provide you with exceptional heart care, we have created designated Provider Care Teams.  These Care Teams include your primary Cardiologist (physician) and Advanced Practice Providers (APPs -  Physician Assistants and Nurse Practitioners) who all work together to provide you with the care you need, when you need it.  We recommend signing up for the patient portal called "MyChart".  Sign up information is provided on this After Visit Summary.  MyChart is used to connect with patients for Virtual Visits (Telemedicine).  Patients are able to view lab/test results, encounter notes, upcoming appointments, etc.  Non-urgent messages can be sent to your provider as well.   To learn more about what you can do with MyChart, go to [email or web address].    Your next appointment:   3 months with Dr. Debara Pickett

## 2022-11-24 ENCOUNTER — Encounter: Payer: Self-pay | Admitting: Internal Medicine

## 2022-11-24 ENCOUNTER — Ambulatory Visit: Payer: BC Managed Care – PPO | Admitting: Internal Medicine

## 2022-11-24 VITALS — BP 138/86 | HR 93 | Temp 101.2°F | Ht 68.0 in | Wt 233.0 lb

## 2022-11-24 DIAGNOSIS — J101 Influenza due to other identified influenza virus with other respiratory manifestations: Secondary | ICD-10-CM

## 2022-11-24 DIAGNOSIS — R509 Fever, unspecified: Secondary | ICD-10-CM

## 2022-11-24 LAB — POC COVID19 BINAXNOW: SARS Coronavirus 2 Ag: NEGATIVE

## 2022-11-24 LAB — POCT INFLUENZA A/B
Influenza A, POC: POSITIVE — AB
Influenza B, POC: NEGATIVE

## 2022-11-24 MED ORDER — OSELTAMIVIR PHOSPHATE 75 MG PO CAPS
75.0000 mg | ORAL_CAPSULE | Freq: Two times a day (BID) | ORAL | 0 refills | Status: DC
Start: 2022-11-24 — End: 2023-04-02

## 2022-11-24 NOTE — Progress Notes (Signed)
Subjective:    Patient ID: Erik Mitchell, male    DOB: 1961/04/29, 61 y.o.   MRN: [id number]  HPI Here due to respiratory illness  Exposed to ill coworker earlier this week Started 2 days ago----headache, cough, chest burning, runny nose Some fever---some chills Has muscle aching No SOB Cough is intermittently productive of yellow green sputum No ear pain  Didn't do COVID testing  Current Outpatient Medications on File Prior to Visit  Medication Sig Dispense Refill   albuterol (VENTOLIN HFA) 108 (90 Base) MCG/ACT inhaler Inhale 1-2 puffs into the lungs every 6 (six) hours as needed for wheezing or shortness of breath. 18 g 0   amLODipine (NORVASC) 5 MG tablet TAKE 1 TABLET BY MOUTH EVERY DAY 90 tablet 3   Ascorbic Acid (VITAMIN C PO) Take 1 tablet by mouth at bedtime.      aspirin 81 MG tablet Take 81 mg by mouth at bedtime.     atorvastatin (LIPITOR) 40 MG tablet TAKE 1 TABLET BY MOUTH EVERYDAY AT BEDTIME 90 tablet 3   esomeprazole (NEXIUM) 40 MG capsule TAKE 1 CAPSULE BY MOUTH EVERY DAY 90 capsule 3   nitroGLYCERIN (NITROSTAT) 0.4 MG SL tablet PLACE 1 TABLET UNDER THE TONGUE EVERY 5 (FIVE) MINUTES AS NEEDED FOR CHEST PAIN (MAX 3 DOSES). 75 tablet 1   valACYclovir (VALTREX) 500 MG tablet Take 1 tablet (500 mg total) by mouth daily. 90 tablet 3   zinc gluconate 50 MG tablet Take 50 mg by mouth at bedtime.      No current facility-administered medications on file prior to visit.    Allergies  Allergen Reactions   Bee Venom     Swelling, short of breath   Codeine Other (See Comments)    unknown   Meloxicam Other (See Comments)    GI upset   Naproxen Other (See Comments)    GI upset    Past Medical History:  Diagnosis Date   Allergic rhinitis 02/16/2003   Allergy    Anxiety    GERD (gastroesophageal reflux disease) 12/18/2002   H/O cold sores    valtrex suppression   History of ETT 11/96   wnl, cardiology consult0 [place] 01/97   Hyperlipemia 08/19/1995    Hypertension 08/19/95   Shingles     Past Surgical History:  Procedure Laterality Date   LEFT HEART CATH AND CORONARY ANGIOGRAPHY N/A 11/23/2020   Procedure: LEFT HEART CATH AND CORONARY ANGIOGRAPHY;  Surgeon: Martinique, Peter M, MD;  Location: [place] [facility];  Service: Cardiovascular;  Laterality: N/A;   TENDON REPAIR     R 4th finger   TONSILLECTOMY      Family History  Problem Relation Age of Onset   Diabetes Mother    Heart disease Mother    Cancer Mother    Colon cancer Mother        presumed   Heart disease Father        MI CABG x 3 Pacer   Diabetes Brother    Heart disease Brother        MI x 2 Stents   Prostate cancer Neg Hx    Rectal cancer Neg Hx    Stomach cancer Neg Hx     Social History   Socioeconomic History   Marital status: Married    Spouse name: Not on file   Number of children: Not on file   Years of education: Not on file   Highest education level: Not on file  Occupational History   Occupation: Tool Maker    Employer: [employer]  Tobacco Use   Smoking status: Never   Smokeless tobacco: Never  Substance and Sexual Activity   Alcohol use: Yes    Alcohol/week: 0.0 standard drinks of alcohol    Comment: very little   Drug use: No   Sexual activity: Yes  Other Topics Concern   Not on file  Social History Narrative   Divorced; remarried in 2004; 1 stepdaughter   He has prev laid off from [employer] after 23 years   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    Appearance:  Normal appearance.  HENT:     Right Ear: Tympanic membrane and ear canal normal.     Left Ear: Tympanic membrane and ear canal normal.     Nose: Congestion present.     Mouth/Throat:     Pharynx: No oropharyngeal exudate or posterior oropharyngeal erythema.  Pulmonary:     Effort: Pulmonary effort is normal.     Breath sounds: Normal breath sounds. No wheezing or rales.  Musculoskeletal:     Cervical back: Neck supple.  Lymphadenopathy:     Cervical: No cervical adenopathy.  Neurological:     Mental Status: He is alert.            Assessment & Plan:

## 2022-11-24 NOTE — Assessment & Plan Note (Addendum)
No apparent pneumonia---discussed ER evaluation if significant SOB Analgesics Discussed antivirals---will start tamiflu (but stop if apparent GI side effects) OOW till next week

## 2023-02-15 ENCOUNTER — Encounter: Payer: Self-pay | Admitting: Internal Medicine

## 2023-02-15 ENCOUNTER — Ambulatory Visit: Payer: BC Managed Care – PPO | Attending: Internal Medicine | Admitting: Internal Medicine

## 2023-02-15 VITALS — BP 139/86 | HR 65 | Ht 68.0 in | Wt 235.0 lb

## 2023-02-15 DIAGNOSIS — I77810 Thoracic aortic ectasia: Secondary | ICD-10-CM | POA: Diagnosis not present

## 2023-02-15 DIAGNOSIS — E785 Hyperlipidemia, unspecified: Secondary | ICD-10-CM | POA: Diagnosis not present

## 2023-02-15 DIAGNOSIS — I2510 Atherosclerotic heart disease of native coronary artery without angina pectoris: Secondary | ICD-10-CM

## 2023-02-15 DIAGNOSIS — I2584 Coronary atherosclerosis due to calcified coronary lesion: Secondary | ICD-10-CM | POA: Diagnosis not present

## 2023-02-15 NOTE — Patient Instructions (Signed)
Medication Instructions:  NO CHANGES  *If you need a refill on your cardiac medications before your next appointment, please call your pharmacy*   Lab Work: FASTING lab work to check cholesterol and metabolic panel -- complete before CT test  If you have labs (blood work) drawn today and your tests are completely normal, you will receive your results only by: [employer] (if you have MyChart) OR A paper copy in the mail If you have any lab test that is abnormal or we need to change your treatment, we will call you to review the results.   Testing/Procedures: CT angiogram chest/aorta   Follow-Up: At [facility], you and your health needs are our priority.  As part of our continuing mission to provide you with exceptional heart care, we have created designated Provider Care Teams.  These Care Teams include your primary Cardiologist (physician) and Advanced Practice Providers (APPs -  Physician Assistants and Nurse Practitioners) who all work together to provide you with the care you need, when you need it.  We recommend signing up for the patient portal called "MyChart".  Sign up information is provided on this After Visit Summary.  MyChart is used to connect with patients for Virtual Visits (Telemedicine).  Patients are able to view lab/test results, encounter notes, upcoming appointments, etc.  Non-urgent messages can be sent to your provider as well.   To learn more about what you can do with MyChart, go to [email or web address].    Your next appointment:   6 month(s)  Provider:   Pixie Casino, MD

## 2023-02-15 NOTE — Progress Notes (Signed)
OFFICE NOTE  Chief Complaint:  Follow-up  Primary Care Physician: Tonia Ghent, MD  HPI:  Erik Mitchell is a 62 y.o. male with a past medial history significant for hypertension, dyslipidemia and bradycardia.  Erik Mitchell is a pleasant 62 year old male previously seen by Dr. Johnsie Cancel for bradycardia.  He has reported some exertional dyspnea and infrequent chest discomfort.  He underwent exercise treadmill stress testing in 1996 and a Myoview stress test in 2003 which showed normal LVEF of 67%.  He reports being able to augment heart rate with exercise.  He had a repeat treadmill stress test in February 2020 which was negative as well.  Heart rate increased to 146 with max exercise.  He does report some nocturnal bradycardia.  His wife is a patient of mine and she wanted him to see me for a second opinion.  More recently an echo in February 2020 revealed an EF of 60 to 65% however the aortic root was mildly dilated at 4 cm.  A CTA was recommended in February 2021, but has not been performed.  Since he last saw Dr. Johnsie Cancel he has had some shortness of breath with exertion and gets some infrequent chest discomfort.  He describes it as sharp or stabbing in the left anterior chest wall and lasts only for a few seconds.  Sometimes with exertion but at other times can come at rest.  Overall seems somewhat atypical.  Heart rate today was 59.  EKG demonstrated sinus bradycardia without ischemic changes.  Normal axes and normal intervals.  11/02/2022  Erik Mitchell is seen today in follow-up.  He is accompanied by his wife.  When I last saw him he was having symptoms concerning for unstable angina.  He had CT coronary angiography which demonstrated a high calcium score of 882, 97th percentile for age and sex matched control.  His aorta was dilated up to 43 mm.  A definitive cardiac catheterization was recommended.  Cath was performed in December 2021 however only showed mild nonobstructive coronary disease with  normal LV function.  Subsequently aggressive medical therapy was recommended.  Today in follow-up he reports that he does get some fatigue and shortness of breath with marked exertion but no chest pain.  Blood pressure is reasonably well controlled.  His cholesterol is not at target with total 192, HDL 28, triglycerides 183 and LDL 127.  This is despite therapy on atorvastatin 40 mg daily.  He also takes aspirin.  02/15/2023  Erik Mitchell is seen today in follow-up. He had influenza A in December an was quite sick but has recovered. He did not have any labs drawn recently. He is overdue for reassessment of his aortic aneurysm.  He gets occasional sharp chest pain at rest.  PMHx:  Past Medical History:  Diagnosis Date   Allergic rhinitis 02/16/2003   Allergy    Anxiety    GERD (gastroesophageal reflux disease) 12/18/2002   H/O cold sores    valtrex suppression   History of ETT 11/96   wnl, cardiology consult0 Mitchell 01/97   Hyperlipemia 08/19/1995   Hypertension 08/19/95   Shingles     Past Surgical History:  Procedure Laterality Date   LEFT HEART CATH AND CORONARY ANGIOGRAPHY N/A 11/23/2020   Procedure: LEFT HEART CATH AND CORONARY ANGIOGRAPHY;  Surgeon: Martinique, Peter M, MD;  Location: [place] [facility];  Service: Cardiovascular;  Laterality: N/A;   TENDON REPAIR     R 4th finger   TONSILLECTOMY  FAMHx:  Family History  Problem Relation Age of Onset   Diabetes Mother    Heart disease Mother    Cancer Mother    Colon cancer Mother        presumed   Heart disease Father        MI CABG x 3 Pacer   Diabetes Brother    Heart disease Brother        MI x 2 Stents   Prostate cancer Neg Hx    Rectal cancer Neg Hx    Stomach cancer Neg Hx     SOCHx:   reports that he has never smoked. He has never used smokeless tobacco. He reports current alcohol use. He reports that he does not use drugs.  ALLERGIES:  Allergies  Allergen Reactions   Bee Venom     Swelling, short of  breath   Codeine Other (See Comments)    unknown   Meloxicam Other (See Comments)    GI upset   Naproxen Other (See Comments)    GI upset    ROS: Pertinent items noted in HPI and remainder of comprehensive ROS otherwise negative.  HOME MEDS: Current Outpatient Medications on File Prior to Visit  Medication Sig Dispense Refill   albuterol (VENTOLIN HFA) 108 (90 Base) MCG/ACT inhaler Inhale 1-2 puffs into the lungs every 6 (six) hours as needed for wheezing or shortness of breath. 18 g 0   amLODipine (NORVASC) 5 MG tablet TAKE 1 TABLET BY MOUTH EVERY DAY 90 tablet 3   Ascorbic Acid (VITAMIN C PO) Take 1 tablet by mouth at bedtime.      aspirin 81 MG tablet Take 81 mg by mouth at bedtime.     atorvastatin (LIPITOR) 40 MG tablet TAKE 1 TABLET BY MOUTH EVERYDAY AT BEDTIME 90 tablet 3   esomeprazole (NEXIUM) 40 MG capsule TAKE 1 CAPSULE BY MOUTH EVERY DAY 90 capsule 3   nitroGLYCERIN (NITROSTAT) 0.4 MG SL tablet PLACE 1 TABLET UNDER THE TONGUE EVERY 5 (FIVE) MINUTES AS NEEDED FOR CHEST PAIN (MAX 3 DOSES). 75 tablet 1   valACYclovir (VALTREX) 500 MG tablet Take 1 tablet (500 mg total) by mouth daily. 90 tablet 3   zinc gluconate 50 MG tablet Take 50 mg by mouth at bedtime.      oseltamivir (TAMIFLU) 75 MG capsule Take 1 capsule (75 mg total) by mouth 2 (two) times daily. (Patient not taking: Reported on 02/15/2023) 10 capsule 0   No current facility-administered medications on file prior to visit.    LABS/IMAGING: No results found for this or any previous visit (from the past 48 hour(s)). No results found.  LIPID PANEL:    Component Value Date/Time   CHOL 192 07/03/2022 0731   TRIG 183.0 (H) 07/03/2022 0731   HDL 28.70 (L) 07/03/2022 0731   CHOLHDL 7 07/03/2022 0731   VLDL 36.6 07/03/2022 0731   LDLCALC 127 (H) 07/03/2022 0731   LDLCALC 157 (H) 10/04/2018 1550   LDLDIRECT 84.0 01/07/2019 1652     WEIGHTS: Wt Readings from Last 3 Encounters:  02/15/23 235 lb 0.3 oz (106.6 kg)   11/24/22 233 lb (105.7 kg)  11/02/22 232 lb 6.4 oz (105.4 kg)    VITALS: BP 139/86 (BP Location: Left Arm, Patient Position: Sitting, Cuff Size: Large)   Pulse 65   Ht '5\' 8"'$  (1.727 m)   Wt 235 lb 0.3 oz (106.6 kg)   SpO2 99%   BMI 35.73 kg/m   EXAM: General appearance: alert and  no distress Neck: no carotid bruit, no JVD and thyroid not enlarged, symmetric, no tenderness/mass/nodules Lungs: clear to auscultation bilaterally Heart: regular rate and rhythm Abdomen: soft, non-tender; bowel sounds normal; no masses,  no organomegaly Extremities: extremities normal, atraumatic, no cyanosis or edema Pulses: 2+ and symmetric Skin: Skin color, texture, turgor normal. 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   Erik Mitchell is due for repeat imaging of his dilated aorta.  Will go ahead and order that today.  He also needs repeat labs.  He is having some atypical type chest pain.  It sharp and mostly noted at rest.  He did have a high calcium score but only had mild coronary disease by cath in 2021.  He would likely need upcoming surgery for hernia repair.  I will happy to address that request with its available.  He has not yet met with the surgeon.  I will review his labs and make adjustments as necessary.  Plan follow-up with me in 6 months or sooner as necessary.  Pixie Casino, MD, [facility], [place] Director of the Advanced Lipid Disorders &  Cardiovascular Risk Reduction Clinic Diplomate of the American Board of Clinical Lipidology Attending Cardiologist  Direct Dial:  [phone]  Fax: [phone]  Website:  [email or web address] 02/15/2023, 4:51 PM

## 2023-04-02 ENCOUNTER — Ambulatory Visit: Payer: BC Managed Care – PPO | Admitting: Family Medicine

## 2023-04-02 ENCOUNTER — Encounter: Payer: Self-pay | Admitting: Family Medicine

## 2023-04-02 VITALS — BP 138/72 | HR 71 | Temp 97.9°F | Ht 68.0 in | Wt 234.0 lb

## 2023-04-02 DIAGNOSIS — K4090 Unilateral inguinal hernia, without obstruction or gangrene, not specified as recurrent: Secondary | ICD-10-CM

## 2023-04-02 NOTE — Assessment & Plan Note (Signed)
Anatomy discussed with patient. Refer to [facility].  If sudden increase in pain, then go to the ER.  Try to limit lifting in the meantime.  He agrees with plan.  Okay for outpatient follow-up.

## 2023-04-02 NOTE — Progress Notes (Signed)
F/u re: RIH.  1/10 pain at rest but more pain lifting at work/leaning more.  Inc in bulge gradually noted but patient.  No L sided sx.  No FCNAVD. R testicle vs scrotum enlargement noted.   No CP with exertion.    Meds, vitals, and allergies reviewed.   ROS: Per HPI unless specifically indicated in ROS section   Nad Ncat Neck supple, no LA Ctab RRR Abd soft, not ttp RIH noted.  I did not try to reduce it given the size.  It does not appear incarcerated.  Skin is still well-perfused.

## 2023-04-02 NOTE — Patient Instructions (Addendum)
Refer to [facility] Surgery.  If sudden increase in pain, then go to the ER.  Try to limit lifting in the meantime.  Take care.  Glad to see you.  If you don't get a call about the referral then let me know.

## 2023-04-04 ENCOUNTER — Encounter: Payer: Self-pay | Admitting: *Deleted

## 2023-04-27 ENCOUNTER — Telehealth: Payer: Self-pay | Admitting: *Deleted

## 2023-04-27 ENCOUNTER — Ambulatory Visit: Payer: Self-pay | Admitting: Surgery

## 2023-04-27 NOTE — Telephone Encounter (Signed)
   Pre-operative Risk Assessment    Patient Name: Erik Mitchell  DOB: Sep 17, 1961 MRN: [id number]      Request for Surgical Clearance    Procedure:   HERNIA SURGERY  Date of Surgery:  Clearance TBD                                 Surgeon:  Twana First, MD Surgeon's Group or Practice Name:  [facility] Phone number:  [phone] Fax number:  [phone]   Type of Clearance Requested:   - Medical  - Pharmacy:  Hold Aspirin NOT INDICATED HOW LONG   Type of Anesthesia:  General    Additional requests/questions:    [name]   04/27/2023, 3:55 PM

## 2023-04-27 NOTE — H&P (Signed)
Erik Mitchell [id number]   Referring Provider:  Sunnie Nielsen, MD   Subjective   Chief Complaint: New Consultation     History of Present Illness:    Very pleasant 62 year old male with history of anxiety, GERD, hyperlipidemia, hypertension, coronary artery disease status post left heart cath-last saw Dr. Rennis Golden little over a month ago.  Presents with right inguinal hernia.  He first noticed this about 6 months ago.  This is mildly painful at rest but more so when he is lifting at work.  Has noted increase in size over time.  No associated GI or urinary symptoms.  He works as a Human resources officer for [employer].   Review of Systems: A complete review of systems was obtained from the patient.  I have reviewed this information and discussed as appropriate with the patient.  See HPI as well for other ROS.   Medical History: Past Medical History:  Diagnosis Date   Anxiety    GERD (gastroesophageal reflux disease)    Hyperlipidemia    Hypertension     There is no problem list on file for this patient.   Past Surgical History:  Procedure Laterality Date   left heart cath and coronary angiography  2001   Left heart cath and coronary angiography     REPAIR EXTENSOR TENDON FINGER Right    Tendon repair     R 4th finger   TONSILLECTOMY     TONSILLECTOMY       Allergies  Allergen Reactions   Venom-Honey Bee Other (See Comments)    Swelling, short of breath   Codeine Unknown   Meloxicam Other (See Comments)    GI Upset   Naproxen Other (See Comments)    GI Upset    Current Outpatient Medications on File Prior to Visit  Medication Sig Dispense Refill   amLODIPine (NORVASC) 5 MG tablet TAKE 1/2 TO 1 TABLET BY MOUTH EVERY DAY     ascorbic acid (VITAMIN C ORAL) Take by mouth     aspirin 81 MG EC tablet Take 81 mg by mouth at bedtime     atorvastatin (LIPITOR) 40 MG tablet Take 1 tablet by mouth at bedtime     cholecalciferol 1000 unit tablet Take by  mouth     esomeprazole (NEXIUM) 40 MG DR capsule Take 1 capsule by mouth once daily     nitroGLYcerin (NITROSTAT) 0.4 MG SL tablet PLACE 1 TABLET UNDER THE TONGUE EVERY 5 (FIVE) MINUTES AS NEEDED FOR CHEST PAIN (MAX 3 DOSES).     valACYclovir (VALTREX) 500 MG tablet Take 1 tablet by mouth once daily     vitamin E 400 UNIT capsule Take 400 Units by mouth once daily     zinc gluconate 50 mg tablet Take 50 mg by mouth at bedtime     No current facility-administered medications on file prior to visit.    Family History  Problem Relation Age of Onset   Colon cancer Mother    Coronary Artery Disease (Blocked arteries around heart) Mother    Diabetes Mother    Coronary Artery Disease (Blocked arteries around heart) Father    Coronary Artery Disease (Blocked arteries around heart) Brother    Diabetes Brother      Social History   Tobacco Use  Smoking Status Never  Smokeless Tobacco Never     Social History   Socioeconomic History   Marital status: Married  Tobacco Use   Smoking status: Never  Smokeless tobacco: Never  Substance and Sexual Activity   Alcohol use: Yes   Drug use: Never    Objective:    Vitals:   04/27/23 1518  BP: (!) 154/90  Pulse: 76  Temp: 37.1 C (98.7 F)  SpO2: 98%  Weight: (!) 107 kg (235 lb 12.8 oz)  Height: 177.8 cm (5\' 10" )  PainSc: 0-No pain  PainLoc: Abdomen    Body mass index is 33.83 kg/m.  Gen: A&Ox3, no distress  Unlabored respirations Abdomen is soft, nontender nondistended.  There is a partially reducible right inguinal hernia which is nontender.  No hernia on the left.  Assessment and Plan:  Diagnoses and all orders for this visit:  Non-recurrent unilateral inguinal hernia without obstruction or gangrene    We discussed the relevant anatomy and we discussed options for repair.  I recommend an open approach and went over the technique of the procedure.  Discussed risks of bleeding, infection, pain, scarring, injury to  structures in the area including nerves, blood vessels, bowel, bladder, risk of chronic pain, hernia recurrence, risk of seroma or hematoma, urinary retention, and risks of general anesthesia including cardiovascular, pulmonary, and thromboembolic complications.  We discussed typical postop recovery, timeline, and activity limitations.  We also discussed the option of ongoing observation, with high rate of ultimately returning for surgery and risk of increasing size/symptoms from the hernia as well as incarceration/strangulation and went over symptoms that should prompt him to seek emergency treatment.  Questions were come and answered to the patient's satisfaction. Patient wishes to proceed with scheduling.   Viera Okonski Carlye Grippe, MD

## 2023-04-30 NOTE — Telephone Encounter (Signed)
1st attempt: I left a message for the patient to call our office to schedule a tele visit for pre-op clearance.

## 2023-04-30 NOTE — Telephone Encounter (Signed)
   Name: Erik Mitchell  DOB: May 15, 1961  MRN: [id number]  Primary Cardiologist: Chrystie Nose, MD  Chart reviewed as part of pre-operative protocol coverage. Because of JUDAS PETTITT past medical history and time since last visit, he will require a follow-up telephone visit in order to better assess preoperative cardiovascular risk.  Pre-op covering staff: - Please schedule appointment and call patient to inform them. If patient already had an upcoming appointment within acceptable timeframe, please add "pre-op clearance" to the appointment notes so provider is aware. - Please contact requesting surgeon's office via preferred method (i.e, phone, fax) to inform them of need for appointment prior to surgery.  Unclear why patient is taking aspirin.  No CAD listed.  Can hold aspirin x 5 to 7 days prior to the procedure if asymptomatic at the time of phone call.  Sharlene Dory, PA-C  04/30/2023, 7:19 AM

## 2023-05-01 NOTE — Telephone Encounter (Signed)
I tried to call the pt and was starting to leave a message when it sounded like someone picked up the phone while I was leaving a message, then phone disconnected. Was calling to set up tele pre op appt.

## 2023-05-03 NOTE — Telephone Encounter (Signed)
Left message to call back to set up tele pre op appt.  

## 2023-05-03 NOTE — Telephone Encounter (Signed)
I will send FYI to requesting office that we have been trying to reach the pt to set up tele pre op appt.

## 2023-05-04 NOTE — Telephone Encounter (Signed)
3 rd attempt trying to reach the pt to set up tele pre op appt. I will send notes as FYI to requesting office we have not been able to reach the pt. I will remove from the pre op call back at this time.

## 2023-06-26 ENCOUNTER — Other Ambulatory Visit: Payer: Self-pay | Admitting: Family Medicine

## 2024-06-25 ENCOUNTER — Other Ambulatory Visit: Payer: Self-pay | Admitting: Family Medicine

## 2024-06-27 ENCOUNTER — Ambulatory Visit: Payer: Self-pay | Admitting: Surgery

## 2024-06-27 ENCOUNTER — Other Ambulatory Visit: Payer: Self-pay | Admitting: Family Medicine

## 2024-06-27 ENCOUNTER — Telehealth: Payer: Self-pay

## 2024-06-27 MED ORDER — VALACYCLOVIR HCL 500 MG PO TABS: 500.0000 mg | ORAL_TABLET | Freq: Every day | ORAL | 0 refills | Status: DC

## 2024-06-27 MED ORDER — ESOMEPRAZOLE MAGNESIUM 40 MG PO CPDR
DELAYED_RELEASE_CAPSULE | ORAL | 0 refills | Status: DC
Start: 2024-06-27 — End: 2024-07-21

## 2024-06-27 MED ORDER — ATORVASTATIN CALCIUM 40 MG PO TABS: ORAL_TABLET | ORAL | 0 refills | Status: DC

## 2024-06-27 MED ORDER — AMLODIPINE BESYLATE 5 MG PO TABS: ORAL_TABLET | ORAL | 0 refills | Status: DC

## 2024-06-27 NOTE — Telephone Encounter (Signed)
 Please schedule patient for physical. Route message back to me.

## 2024-06-27 NOTE — Telephone Encounter (Signed)
 Copied from CRM [id number]. Topic: Clinical - Medication Refill >> Jun 27, 2024 12:12 PM Abigail D wrote: Medication: valACYclovir  (VALTREX ) 500 MG tablet atorvastatin  (LIPITOR) 40 MG tablet esomeprazole  (NEXIUM ) 40 MG capsule amLODipine  (NORVASC ) 5 MG tablet  Has the patient contacted their pharmacy? Yes, pharmacy called them  (Agent: If no, request that the patient contact the pharmacy for the refill. If patient does not wish to contact the pharmacy document the reason why and proceed with request.) (Agent: If yes, when and what did the pharmacy advise?)  This is the patient's preferred pharmacy:  [id number], [place] - [address] [place] [address] Phone: [phone] Fax: [phone]  Is this the correct pharmacy for this prescription? Yes If no, delete pharmacy and type the correct one.   Has the prescription been filled recently? No  Is the patient out of the medication? Yes  Has the patient been seen for an appointment in the last year OR does the patient have an upcoming appointment? Yes  Can we respond through MyChart? Yes  Agent: Please be advised that Rx refills may take up to 3 business days. We ask that you follow-up with your pharmacy.

## 2024-06-27 NOTE — Telephone Encounter (Signed)
 Unable to leave vm sent Mychart message

## 2024-06-27 NOTE — Telephone Encounter (Signed)
   Pre-operative Risk Assessment    Patient Name: Erik Mitchell  DOB: 1961/10/22 MRN: [id number]   Date of last office visit: 02/15/23 Date of next office visit: Not scheduled   Request for Surgical Clearance    Procedure:  Hernia Surgery  Date of Surgery:  Clearance TBD                                Surgeon:  Mitzie Peers, MD Surgeon's Group or Practice Name:  [facility] Phone number:  [phone] Fax number:  [phone]   Type of Clearance Requested:   - Medical  - Pharmacy:  Hold Aspirin      Type of Anesthesia:  General    Additional requests/questions:    Bonney Ival LOISE Gerome   06/27/2024, 12:52 PM

## 2024-06-27 NOTE — H&P (Signed)
 Erik Mitchell [id number]   Referring Provider:  Self   Subjective   Chief Complaint: Follow-up ( [facility], DISCUSS SX)     History of Present Illness: Returns to discuss surgery for his known right inguinal hernia.  I last saw him about a year ago at which time plans were made to proceed with surgery, but due to work and other issues he ended up not pursuing this.  In the interim, the hernia has become significantly larger.  Notes he has not seen his cardiologist since before our last visit.   04/27/23: Very pleasant 63 year old male with history of anxiety, GERD, hyperlipidemia, hypertension, coronary artery disease status post left heart cath-last saw Dr. Mona little over a month ago.  Presents with right inguinal hernia.  He first noticed this about 6 months ago.  This is mildly painful at rest but more so when he is lifting at work.  Has noted increase in size over time.  No associated GI or urinary symptoms.  He works as a Human resources officer for [employer].   Review of Systems: A complete review of systems was obtained from the patient.  I have reviewed this information and discussed as appropriate with the patient.  See HPI as well for other ROS.   Medical History: Past Medical History:  Diagnosis Date   Anxiety    GERD (gastroesophageal reflux disease)    Hyperlipidemia    Hypertension     There is no problem list on file for this patient.   Past Surgical History:  Procedure Laterality Date   left heart cath and coronary angiography  2001   Left heart cath and coronary angiography     REPAIR EXTENSOR TENDON FINGER Right    Tendon repair     R 4th finger   TONSILLECTOMY     TONSILLECTOMY       Allergies  Allergen Reactions   Venom-Honey Bee Other (See Comments)    Swelling, short of breath   Codeine Unknown   Meloxicam  Other (See Comments)    GI Upset   Naproxen  Other (See Comments)    GI Upset    Current Outpatient Medications on File Prior to Visit   Medication Sig Dispense Refill   amLODIPine  (NORVASC ) 5 MG tablet TAKE 1/2 TO 1 TABLET BY MOUTH EVERY DAY     ascorbic acid (VITAMIN C ORAL) Take by mouth     aspirin  81 MG EC tablet Take 81 mg by mouth at bedtime     atorvastatin  (LIPITOR) 40 MG tablet Take 1 tablet by mouth at bedtime     esomeprazole  (NEXIUM ) 40 MG DR capsule Take 1 capsule by mouth once daily     nitroGLYcerin  (NITROSTAT ) 0.4 MG SL tablet PLACE 1 TABLET UNDER THE TONGUE EVERY 5 (FIVE) MINUTES AS NEEDED FOR CHEST PAIN (MAX 3 DOSES).     valACYclovir  (VALTREX ) 500 MG tablet Take 1 tablet by mouth once daily     vitamin E 400 UNIT capsule Take 400 Units by mouth once daily     zinc gluconate 50 mg tablet Take 50 mg by mouth at bedtime     cholecalciferol 1000 unit tablet Take by mouth     No current facility-administered medications on file prior to visit.    Family History  Problem Relation Age of Onset   Colon cancer Mother    Coronary Artery Disease (Blocked arteries around heart) Mother    Diabetes Mother    Coronary Artery Disease (Blocked arteries  around heart) Father    Coronary Artery Disease (Blocked arteries around heart) Brother    Diabetes Brother      Social History   Tobacco Use  Smoking Status Never  Smokeless Tobacco Never     Social History   Socioeconomic History   Marital status: Married  Tobacco Use   Smoking status: Never   Smokeless tobacco: Never  Substance and Sexual Activity   Alcohol use: Yes   Drug use: Never   Social Drivers of Health   Housing Stability: Unknown (06/27/2024)   Housing Stability Vital Sign    Homeless in the Last Year: No    Objective:    Vitals:   06/27/24 1119  PainSc:   2     There is no height or weight on file to calculate BMI.  Gen: A&Ox3, no distress  Unlabored respirations Abdomen is soft, nontender nondistended.  Large inguinoscrotal right inguinal hernia, not reducible at this time.  Assessment and Plan:  Diagnoses and all orders  for this visit:  Non-recurrent unilateral inguinal hernia without obstruction or gangrene Comments: Has progressed to a large, inguinoscrotal, chronically incarcerated hernia     We began discussed the relevant anatomy and we discussed options for repair.  I recommend an open approach and went over the technique of the procedure.  Discussed risks of bleeding, infection, pain, scarring, injury to structures in the area including nerves, blood vessels, bowel, bladder, risk of chronic pain, hernia recurrence, risk of seroma or hematoma, urinary retention, and risks of general anesthesia including cardiovascular, pulmonary, and thromboembolic complications.  We discussed typical postop recovery, timeline, and activity limitations.  Reviewed symptoms that should prompt him to seek emergency treatment between now and surgery.  Questions were welcomed and answered to the patient's satisfaction. Patient wishes to proceed with scheduling.  Will request cardiac clearance and hopefully can get surgery scheduled ASAP.   Journey Castonguay ALAN FREUND, MD

## 2024-06-30 NOTE — Telephone Encounter (Signed)
 Tried to call pt to schedule an in office appt for preop clearance, though no answer.

## 2024-06-30 NOTE — Telephone Encounter (Signed)
   Name: Erik Mitchell  DOB: 1961/01/28  MRN: [id number]  Primary Cardiologist: Vinie JAYSON Maxcy, MD  Chart reviewed as part of pre-operative protocol coverage. Because of [name] past medical history and time since last visit, he will require a follow-up in-office visit in order to better assess preoperative cardiovascular risk. Last OV 01/2023.  Pre-op covering staff: - Please schedule appointment and call patient to inform them. If patient already had an upcoming appointment within acceptable timeframe, please add pre-op clearance to the appointment notes so provider is aware. - Please contact requesting surgeon's office via preferred method (i.e, phone, fax) to inform them of need for appointment prior to surgery.  Re: ASA, has mild nonobstructive disease so should be able to hold unless new issues arise at OV. Will need to be finalized at OV.  Erik Payeur N Saeed Toren, PA-C  06/30/2024, 2:44 PM

## 2024-07-03 NOTE — Telephone Encounter (Signed)
 Scheduled for 07/22/24.

## 2024-07-03 NOTE — Telephone Encounter (Signed)
 3rd attempt : Called patient, Na, no VM box to leave message.

## 2024-07-04 NOTE — Telephone Encounter (Signed)
 3rd attempt made yesterday, will route to surgeon's office and remove from preop pool.

## 2024-07-07 ENCOUNTER — Telehealth: Payer: Self-pay | Admitting: Internal Medicine

## 2024-07-07 NOTE — Telephone Encounter (Signed)
 Pt has IN OFFICE preop clearance appt 07/09/24 with Dr. Mona

## 2024-07-07 NOTE — Telephone Encounter (Signed)
 Wife is following up. Phone number on file has been updated.

## 2024-07-07 NOTE — Telephone Encounter (Signed)
 Wife is requesting updated lab orders.

## 2024-07-07 NOTE — Telephone Encounter (Signed)
 I spoke with the pts wife per his permission and made him a clearance appt with Dr Mona 07/09/24... his wife is asking if he will need any labs prior to seeing Dr Mona.   I advised her that I can send to Dr Mona for his review but if we don;t hear back to go ahead and keep his visit and we can address when he is seen.

## 2024-07-09 ENCOUNTER — Encounter: Payer: Self-pay | Admitting: Internal Medicine

## 2024-07-09 ENCOUNTER — Ambulatory Visit: Attending: Internal Medicine | Admitting: Internal Medicine

## 2024-07-09 VITALS — BP 126/90 | HR 58 | Ht 68.0 in | Wt 232.0 lb

## 2024-07-09 DIAGNOSIS — R079 Chest pain, unspecified: Secondary | ICD-10-CM | POA: Diagnosis not present

## 2024-07-09 DIAGNOSIS — Z01810 Encounter for preprocedural cardiovascular examination: Secondary | ICD-10-CM | POA: Diagnosis not present

## 2024-07-09 DIAGNOSIS — E785 Hyperlipidemia, unspecified: Secondary | ICD-10-CM | POA: Diagnosis not present

## 2024-07-09 DIAGNOSIS — I2510 Atherosclerotic heart disease of native coronary artery without angina pectoris: Secondary | ICD-10-CM | POA: Diagnosis not present

## 2024-07-09 DIAGNOSIS — I77810 Thoracic aortic ectasia: Secondary | ICD-10-CM

## 2024-07-09 NOTE — Progress Notes (Signed)
 OFFICE NOTE  Chief Complaint:  Follow-up  Primary Care Physician: Cleatus Arlyss RAMAN, MD  HPI:  Erik Mitchell is a 63 y.o. male with a past medial history significant for hypertension, dyslipidemia and bradycardia.  Erik Mitchell is a pleasant 63 year old male previously seen by Dr. Delford for bradycardia.  He has reported some exertional dyspnea and infrequent chest discomfort.  He underwent exercise treadmill stress testing in 1996 and a Myoview stress test in 2003 which showed normal LVEF of 67%.  He reports being able to augment heart rate with exercise.  He had a repeat treadmill stress test in February 2020 which was negative as well.  Heart rate increased to 146 with max exercise.  He does report some nocturnal bradycardia.  His wife is a patient of mine and she wanted him to see me for a second opinion.  More recently an echo in February 2020 revealed an EF of 60 to 65% however the aortic root was mildly dilated at 4 cm.  A CTA was recommended in February 2021, but has not been performed.  Since he last saw Dr. Delford he has had some shortness of breath with exertion and gets some infrequent chest discomfort.  He describes it as sharp or stabbing in the left anterior chest wall and lasts only for a few seconds.  Sometimes with exertion but at other times can come at rest.  Overall seems somewhat atypical.  Heart rate today was 59.  EKG demonstrated sinus bradycardia without ischemic changes.  Normal axes and normal intervals.  11/02/2022  Erik Mitchell is seen today in follow-up.  He is accompanied by his wife.  When I last saw him he was having symptoms concerning for unstable angina.  He had CT coronary angiography which demonstrated a high calcium  score of 882, 97th percentile for age and sex matched control.  His aorta was dilated up to 43 mm.  A definitive cardiac catheterization was recommended.  Cath was performed in December 2021 however only showed mild nonobstructive coronary disease with  normal LV function.  Subsequently aggressive medical therapy was recommended.  Today in follow-up he reports that he does get some fatigue and shortness of breath with marked exertion but no chest pain.  Blood pressure is reasonably well controlled.  His cholesterol is not at target with total 192, HDL 28, triglycerides 183 and LDL 127.  This is despite therapy on atorvastatin  40 mg daily.  He also takes aspirin .  02/15/2023  Erik Mitchell is seen today in follow-up. He had influenza A in December an was quite sick but has recovered. He did not have any labs drawn recently. He is overdue for reassessment of his aortic aneurysm.  He gets occasional sharp chest pain at rest.  07/09/2024  Erik Mitchell is seen today in follow-up.  He was referred back for evaluation for preoperative testing.  When I last saw him he was contemplating surgery for hernia which is now incarcerated but not strangulated.  He will need surgery likely in the next couple weeks.  He had coronary artery disease and cardiac catheterization however that only showed mild nonobstructive disease in 2021.  Since then he has been less active mostly due to his hernia and has had a couple of episodes of chest pain which sound somewhat atypical including 1 episode which occurred spontaneously with pain in the left and right side of his chest.  He also has a history of a dilated aortic root to 43 mm which  has not been reassessed by imaging.  I had ordered a repeat CT last year however for some reason it was not performed.  PMHx:  Past Medical History:  Diagnosis Date   Allergic rhinitis 02/16/2003   Allergy    Anxiety    GERD (gastroesophageal reflux disease) 12/18/2002   H/O cold sores    valtrex  suppression   History of ETT 11/96   wnl, cardiology consult0 [place] 01/97   Hyperlipemia 08/19/1995   Hypertension 08/19/95   Shingles     Past Surgical History:  Procedure Laterality Date   LEFT HEART CATH AND CORONARY ANGIOGRAPHY N/A  11/23/2020   Procedure: LEFT HEART CATH AND CORONARY ANGIOGRAPHY;  Surgeon: Swaziland, Peter M, MD;  Location: [facility] INVASIVE CV LAB;  Service: Cardiovascular;  Laterality: N/A;   TENDON REPAIR     R 4th finger   TONSILLECTOMY      FAMHx:  Family History  Problem Relation Age of Onset   Diabetes Mother    Heart disease Mother    Cancer Mother    Colon cancer Mother        presumed   Heart disease Father        MI CABG x 3 Pacer   Diabetes Brother    Heart disease Brother        MI x 2 Stents   Prostate cancer Neg Hx    Rectal cancer Neg Hx    Stomach cancer Neg Hx     SOCHx:   reports that he has never smoked. He has never used smokeless tobacco. He reports current alcohol use. He reports that he does not use drugs.  ALLERGIES:  Allergies  Allergen Reactions   Bee Venom     Swelling, short of breath   Codeine Other (See Comments)    unknown   Meloxicam  Other (See Comments)    GI upset   Naproxen  Other (See Comments)    GI upset    ROS: Pertinent items noted in HPI and remainder of comprehensive ROS otherwise negative.  HOME MEDS: Current Outpatient Medications on File Prior to Visit  Medication Sig Dispense Refill   albuterol  (VENTOLIN  HFA) 108 (90 Base) MCG/ACT inhaler Inhale 1-2 puffs into the lungs every 6 (six) hours as needed for wheezing or shortness of breath. 18 g 0   amLODipine  (NORVASC ) 5 MG tablet TAKE 1 TABLET BY MOUTH EVERY DAY 30 tablet 0   Ascorbic Acid (VITAMIN C PO) Take 1 tablet by mouth at bedtime.      aspirin  81 MG tablet Take 81 mg by mouth at bedtime.     atorvastatin  (LIPITOR) 40 MG tablet TAKE 1 TABLET BY MOUTH EVERYDAY AT BEDTIME 30 tablet 0   esomeprazole  (NEXIUM ) 40 MG capsule TAKE 1 CAPSULE BY MOUTH EVERY DAY 30 capsule 0   nitroGLYCERIN  (NITROSTAT ) 0.4 MG SL tablet PLACE 1 TABLET UNDER THE TONGUE EVERY 5 (FIVE) MINUTES AS NEEDED FOR CHEST PAIN (MAX 3 DOSES). 75 tablet 1   valACYclovir  (VALTREX ) 500 MG tablet Take 1 tablet (500 mg total) by  mouth daily. 30 tablet 0   zinc gluconate 50 MG tablet Take 50 mg by mouth at bedtime.      trimethoprim -polymyxin b  (POLYTRIM ) ophthalmic solution PLACE 1 DROP INTO THE LEFT EYE IN THE MORNING, AT NOON, IN THE EVENING, AND AT BEDTIME. 10 mL 0   No current facility-administered medications on file prior to visit.    LABS/IMAGING: No results found for this or any previous visit (from  the past 48 hours). No results found.  LIPID PANEL:    Component Value Date/Time   CHOL 192 07/03/2022 0731   TRIG 183.0 (H) 07/03/2022 0731   HDL 28.70 (L) 07/03/2022 0731   CHOLHDL 7 07/03/2022 0731   VLDL 36.6 07/03/2022 0731   LDLCALC 127 (H) 07/03/2022 0731   LDLCALC 157 (H) 10/04/2018 1550   LDLDIRECT 84.0 01/07/2019 1652     WEIGHTS: Wt Readings from Last 3 Encounters:  07/09/24 232 lb (105.2 kg)  04/02/23 234 lb (106.1 kg)  02/15/23 235 lb 0.3 oz (106.6 kg)    VITALS: BP (!) 126/90   Pulse (!) 58   Ht 5' 8 (1.727 m)   Wt 232 lb (105.2 kg)   SpO2 97%   BMI 35.28 kg/m   EXAM: General appearance: alert and no distress Neck: no carotid bruit, no JVD and thyroid not enlarged, symmetric, no tenderness/mass/nodules Lungs: clear to auscultation bilaterally Heart: regular rate and rhythm Abdomen: soft, non-tender; bowel sounds normal; no masses,  no organomegaly Extremities: extremities normal, atraumatic, no cyanosis or edema Pulses: 2+ and symmetric Skin: Skin color, texture, turgor normal. No rashes or lesions Neurologic: Grossly normal Psych: Pleasant  EKG: EKG Interpretation Date/Time:  Wednesday July 09 2024 13:26:14 EDT Ventricular Rate:  58 PR Interval:  164 QRS Duration:  86 QT Interval:  438 QTC Calculation: 429 R Axis:   -16  Text Interpretation: Sinus bradycardia When compared with ECG of 23-Nov-2020 09:44, No significant change was found Confirmed by Mona Kent [phone]) on 07/09/2024 1:37:21 PM    ASSESSMENT: Indeterminant preoperative risk for hernia  repair Coronary artery calcification with mild nonobstructive coronary disease by cath (2021), CAC score of 882, 97th percentile for age and sex matched control Family history of coronary disease in father and brother Hypertension Dyslipidemia 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   Erik Mitchell has a large incarcerated inguinal hernia which will require repair.  Unfortunately his not been as active and he did have some mild nonobstructive coronary disease by cath in 2021 with a high calcium  score.  He also has a strong family history of heart disease and since he is unable to be as active it is difficult to assess whether he is really symptomatic.  He did have an episode of chest pain which seems somewhat atypical but was at rest.  I think it is reasonable to consider a Lexiscan Myoview for further restratification.  In addition this will give us  an opportunity with CT attenuation correction to evaluate his dilated aortic root.  I will contact him with those results.  We can hopefully get this done next week as his surgeon would like to proceed within the next couple of weeks.  Kent KYM Mona, MD, [facility], FACP  [place]  [facility]  Medical Director of the Advanced Lipid Disorders &  Cardiovascular Risk Reduction Clinic Diplomate of the American Board of Clinical Lipidology Attending Cardiologist  Direct Dial: [phone]  Fax: [phone]  Website:  [email or web address]   Kent BROCKS Keirston Saephanh 07/09/2024, 1:37 PM

## 2024-07-09 NOTE — Patient Instructions (Addendum)
 Medication Instructions:  Your physician recommends that you continue on your current medications as directed. Please refer to the Current Medication list given to you today.  *If you need a refill on your cardiac medications before your next appointment, please call your pharmacy*  Testing/Procedures: Your physician has requested that you have a lexiscan myoview. For further information please visit [email or web address] Please follow instruction sheet, as given.  Follow-Up: At [facility], you and your health needs are our priority.  As part of our continuing mission to provide you with exceptional heart care, our providers are all part of one team.  This team includes your primary Cardiologist (physician) and Advanced Practice Providers or APPs (Physician Assistants and Nurse Practitioners) who all work together to provide you with the care you need, when you need it.  Your next appointment:   1 year(s)  Provider:   Vinie JAYSON Maxcy, MD  We recommend signing up for the patient portal called MyChart.  Sign up information is provided on this After Visit Summary.  MyChart is used to connect with patients for Virtual Visits (Telemedicine).  Patients are able to view lab/test results, encounter notes, upcoming appointments, etc.  Non-urgent messages can be sent to your provider as well.   To learn more about what you can do with MyChart, go to [email or web address].   Other Instructions Lexiscan Myoview (Stress Test) Instructions  Please arrive 15 minutes prior to your appointment time for registration and insurance purposes.   The test will take approximately 3 to 4 hours to complete; you may bring reading material.  If someone comes with you to your appointment, they will need to remain in the main lobby due to limited space in the testing area. **If you are pregnant or breastfeeding, please notify the nuclear lab prior to your appointment**   How to prepare for your Myocardial  Perfusion Test: Do not eat or drink 3 hours prior to your test, except you may have water. Do not consume products containing caffeine (regular or decaffeinated) 12 hours prior to your test. (ex: coffee, chocolate, sodas, tea). Do bring a list of your current medications with you.  If not listed below, you may take your medications as normal. Do wear comfortable clothes (no dresses or overalls) and walking shoes, tennis shoes preferred (No heels or open toe shoes are allowed). Do NOT wear cologne, perfume, aftershave, or lotions (deodorant is allowed). If these instructions are not followed, your test will have to be rescheduled.   Please report to [address] [place], [place] [address] for your test.  If you have questions or concerns about your appointment, you can call the Nuclear Lab at [phone].   If you cannot keep your appointment, please provide 24 hours notification to the Nuclear Lab, to avoid a possible $50 charge to your account.

## 2024-07-09 NOTE — H&P (View-Only) (Signed)
 OFFICE NOTE  Chief Complaint:  Follow-up  Primary Care Physician: Cleatus Arlyss RAMAN, MD  HPI:  Erik Mitchell is a 63 y.o. male with a past medial history significant for hypertension, dyslipidemia and bradycardia.  Erik Mitchell is a pleasant 63 year old male previously seen by Dr. Delford for bradycardia.  He has reported some exertional dyspnea and infrequent chest discomfort.  He underwent exercise treadmill stress testing in 1996 and a Myoview stress test in 2003 which showed normal LVEF of 67%.  He reports being able to augment heart rate with exercise.  He had a repeat treadmill stress test in February 2020 which was negative as well.  Heart rate increased to 146 with max exercise.  He does report some nocturnal bradycardia.  His wife is a patient of mine and she wanted him to see me for a second opinion.  More recently an echo in February 2020 revealed an EF of 60 to 65% however the aortic root was mildly dilated at 4 cm.  A CTA was recommended in February 2021, but has not been performed.  Since he last saw Dr. Delford he has had some shortness of breath with exertion and gets some infrequent chest discomfort.  He describes it as sharp or stabbing in the left anterior chest wall and lasts only for a few seconds.  Sometimes with exertion but at other times can come at rest.  Overall seems somewhat atypical.  Heart rate today was 59.  EKG demonstrated sinus bradycardia without ischemic changes.  Normal axes and normal intervals.  11/02/2022  Erik Mitchell is seen today in follow-up.  He is accompanied by his wife.  When I last saw him he was having symptoms concerning for unstable angina.  He had CT coronary angiography which demonstrated a high calcium  score of 882, 97th percentile for age and sex matched control.  His aorta was dilated up to 43 mm.  A definitive cardiac catheterization was recommended.  Cath was performed in December 2021 however only showed mild nonobstructive coronary disease with  normal LV function.  Subsequently aggressive medical therapy was recommended.  Today in follow-up he reports that he does get some fatigue and shortness of breath with marked exertion but no chest pain.  Blood pressure is reasonably well controlled.  His cholesterol is not at target with total 192, HDL 28, triglycerides 183 and LDL 127.  This is despite therapy on atorvastatin  40 mg daily.  He also takes aspirin .  02/15/2023  Erik Mitchell is seen today in follow-up. He had influenza A in December an was quite sick but has recovered. He did not have any labs drawn recently. He is overdue for reassessment of his aortic aneurysm.  He gets occasional sharp chest pain at rest.  07/09/2024  Erik Mitchell is seen today in follow-up.  He was referred back for evaluation for preoperative testing.  When I last saw him he was contemplating surgery for hernia which is now incarcerated but not strangulated.  He will need surgery likely in the next couple weeks.  He had coronary artery disease and cardiac catheterization however that only showed mild nonobstructive disease in 2021.  Since then he has been less active mostly due to his hernia and has had a couple of episodes of chest pain which sound somewhat atypical including 1 episode which occurred spontaneously with pain in the left and right side of his chest.  He also has a history of a dilated aortic root to 43 mm which  has not been reassessed by imaging.  I had ordered a repeat CT last year however for some reason it was not performed.  PMHx:  Past Medical History:  Diagnosis Date   Allergic rhinitis 02/16/2003   Allergy    Anxiety    GERD (gastroesophageal reflux disease) 12/18/2002   H/O cold sores    valtrex  suppression   History of ETT 11/96   wnl, cardiology consult0 [place] 01/97   Hyperlipemia 08/19/1995   Hypertension 08/19/95   Shingles     Past Surgical History:  Procedure Laterality Date   LEFT HEART CATH AND CORONARY ANGIOGRAPHY N/A  11/23/2020   Procedure: LEFT HEART CATH AND CORONARY ANGIOGRAPHY;  Surgeon: Swaziland, Peter M, MD;  Location: [facility] INVASIVE CV LAB;  Service: Cardiovascular;  Laterality: N/A;   TENDON REPAIR     R 4th finger   TONSILLECTOMY      FAMHx:  Family History  Problem Relation Age of Onset   Diabetes Mother    Heart disease Mother    Cancer Mother    Colon cancer Mother        presumed   Heart disease Father        MI CABG x 3 Pacer   Diabetes Brother    Heart disease Brother        MI x 2 Stents   Prostate cancer Neg Hx    Rectal cancer Neg Hx    Stomach cancer Neg Hx     SOCHx:   reports that he has never smoked. He has never used smokeless tobacco. He reports current alcohol use. He reports that he does not use drugs.  ALLERGIES:  Allergies  Allergen Reactions   Bee Venom     Swelling, short of breath   Codeine Other (See Comments)    unknown   Meloxicam  Other (See Comments)    GI upset   Naproxen  Other (See Comments)    GI upset    ROS: Pertinent items noted in HPI and remainder of comprehensive ROS otherwise negative.  HOME MEDS: Current Outpatient Medications on File Prior to Visit  Medication Sig Dispense Refill   albuterol  (VENTOLIN  HFA) 108 (90 Base) MCG/ACT inhaler Inhale 1-2 puffs into the lungs every 6 (six) hours as needed for wheezing or shortness of breath. 18 g 0   amLODipine  (NORVASC ) 5 MG tablet TAKE 1 TABLET BY MOUTH EVERY DAY 30 tablet 0   Ascorbic Acid (VITAMIN C PO) Take 1 tablet by mouth at bedtime.      aspirin  81 MG tablet Take 81 mg by mouth at bedtime.     atorvastatin  (LIPITOR) 40 MG tablet TAKE 1 TABLET BY MOUTH EVERYDAY AT BEDTIME 30 tablet 0   esomeprazole  (NEXIUM ) 40 MG capsule TAKE 1 CAPSULE BY MOUTH EVERY DAY 30 capsule 0   nitroGLYCERIN  (NITROSTAT ) 0.4 MG SL tablet PLACE 1 TABLET UNDER THE TONGUE EVERY 5 (FIVE) MINUTES AS NEEDED FOR CHEST PAIN (MAX 3 DOSES). 75 tablet 1   valACYclovir  (VALTREX ) 500 MG tablet Take 1 tablet (500 mg total) by  mouth daily. 30 tablet 0   zinc gluconate 50 MG tablet Take 50 mg by mouth at bedtime.      trimethoprim -polymyxin b  (POLYTRIM ) ophthalmic solution PLACE 1 DROP INTO THE LEFT EYE IN THE MORNING, AT NOON, IN THE EVENING, AND AT BEDTIME. 10 mL 0   No current facility-administered medications on file prior to visit.    LABS/IMAGING: No results found for this or any previous visit (from  the past 48 hours). No results found.  LIPID PANEL:    Component Value Date/Time   CHOL 192 07/03/2022 0731   TRIG 183.0 (H) 07/03/2022 0731   HDL 28.70 (L) 07/03/2022 0731   CHOLHDL 7 07/03/2022 0731   VLDL 36.6 07/03/2022 0731   LDLCALC 127 (H) 07/03/2022 0731   LDLCALC 157 (H) 10/04/2018 1550   LDLDIRECT 84.0 01/07/2019 1652     WEIGHTS: Wt Readings from Last 3 Encounters:  07/09/24 232 lb (105.2 kg)  04/02/23 234 lb (106.1 kg)  02/15/23 235 lb 0.3 oz (106.6 kg)    VITALS: BP (!) 126/90   Pulse (!) 58   Ht 5' 8 (1.727 m)   Wt 232 lb (105.2 kg)   SpO2 97%   BMI 35.28 kg/m   EXAM: General appearance: alert and no distress Neck: no carotid bruit, no JVD and thyroid not enlarged, symmetric, no tenderness/mass/nodules Lungs: clear to auscultation bilaterally Heart: regular rate and rhythm Abdomen: soft, non-tender; bowel sounds normal; no masses,  no organomegaly Extremities: extremities normal, atraumatic, no cyanosis or edema Pulses: 2+ and symmetric Skin: Skin color, texture, turgor normal. No rashes or lesions Neurologic: Grossly normal Psych: Pleasant  EKG: EKG Interpretation Date/Time:  Wednesday July 09 2024 13:26:14 EDT Ventricular Rate:  58 PR Interval:  164 QRS Duration:  86 QT Interval:  438 QTC Calculation: 429 R Axis:   -16  Text Interpretation: Sinus bradycardia When compared with ECG of 23-Nov-2020 09:44, No significant change was found Confirmed by Mona Kent [phone]) on 07/09/2024 1:37:21 PM    ASSESSMENT: Indeterminant preoperative risk for hernia  repair Coronary artery calcification with mild nonobstructive coronary disease by cath (2021), CAC score of 882, 97th percentile for age and sex matched control Family history of coronary disease in father and brother Hypertension Dyslipidemia 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   Mr. Fristoe has a large incarcerated inguinal hernia which will require repair.  Unfortunately his not been as active and he did have some mild nonobstructive coronary disease by cath in 2021 with a high calcium  score.  He also has a strong family history of heart disease and since he is unable to be as active it is difficult to assess whether he is really symptomatic.  He did have an episode of chest pain which seems somewhat atypical but was at rest.  I think it is reasonable to consider a Lexiscan Myoview for further restratification.  In addition this will give us  an opportunity with CT attenuation correction to evaluate his dilated aortic root.  I will contact him with those results.  We can hopefully get this done next week as his surgeon would like to proceed within the next couple of weeks.  Kent KYM Mona, MD, [facility], FACP  [place]  [facility]  Medical Director of the Advanced Lipid Disorders &  Cardiovascular Risk Reduction Clinic Diplomate of the American Board of Clinical Lipidology Attending Cardiologist  Direct Dial: [phone]  Fax: [phone]  Website:  [email or web address]   Kent BROCKS Keirston Saephanh 07/09/2024, 1:37 PM

## 2024-07-15 ENCOUNTER — Encounter (HOSPITAL_COMMUNITY): Payer: Self-pay | Admitting: *Deleted

## 2024-07-15 ENCOUNTER — Telehealth (HOSPITAL_COMMUNITY): Payer: Self-pay | Admitting: *Deleted

## 2024-07-15 NOTE — Telephone Encounter (Signed)
 Instructions for upcoming stress test sent via [employer].  Argentina Bees, RN

## 2024-07-18 ENCOUNTER — Other Ambulatory Visit: Payer: Self-pay | Admitting: Internal Medicine

## 2024-07-18 DIAGNOSIS — Z01810 Encounter for preprocedural cardiovascular examination: Secondary | ICD-10-CM

## 2024-07-18 DIAGNOSIS — R079 Chest pain, unspecified: Secondary | ICD-10-CM

## 2024-07-20 ENCOUNTER — Other Ambulatory Visit: Payer: Self-pay | Admitting: Family Medicine

## 2024-07-21 ENCOUNTER — Ambulatory Visit (HOSPITAL_COMMUNITY)
Admission: RE | Admit: 2024-07-21 | Discharge: 2024-07-21 | Disposition: A | Discharge status: Home / Self Care | Source: Ambulatory Visit | Attending: Cardiology | Admitting: Cardiology

## 2024-07-21 DIAGNOSIS — Z01810 Encounter for preprocedural cardiovascular examination: Secondary | ICD-10-CM | POA: Insufficient documentation

## 2024-07-21 DIAGNOSIS — R079 Chest pain, unspecified: Secondary | ICD-10-CM | POA: Insufficient documentation

## 2024-07-21 LAB — MYOCARDIAL PERFUSION IMAGING
LV dias vol: 111 mL (ref 62–150)
LV sys vol: 35 mL (ref 4.2–5.8)
Nuc Stress EF: 68 %
Peak HR: 97 {beats}/min
Rest HR: 66 {beats}/min
Rest Nuclear Isotope Dose: 10.8 mCi
SDS: 5
SRS: 4
SSS: 6
ST Depression (mm): 0 mm
Stress Nuclear Isotope Dose: 30.7 mCi
TID: 0.9

## 2024-07-21 MED ORDER — REGADENOSON 0.4 MG/5ML IV SOLN
INTRAVENOUS | Status: AC
Start: 2024-07-21 — End: 2024-07-21
  Filled 2024-07-21: qty 5

## 2024-07-21 MED ORDER — REGADENOSON 0.4 MG/5ML IV SOLN
0.4000 mg | Freq: Once | INTRAVENOUS | Status: AC
  Administered 2024-07-21: 0.4 mg via INTRAVENOUS

## 2024-07-21 MED ORDER — TECHNETIUM TC 99M TETROFOSMIN IV KIT
10.8000 | PACK | Freq: Once | INTRAVENOUS | Status: AC | PRN
  Administered 2024-07-21: 10.8 via INTRAVENOUS

## 2024-07-21 MED ORDER — TECHNETIUM TC 99M TETROFOSMIN IV KIT
30.7000 | PACK | Freq: Once | INTRAVENOUS | Status: AC | PRN
Start: 2024-07-21 — End: 2024-07-21
  Administered 2024-07-21: 30.7 via INTRAVENOUS

## 2024-07-22 ENCOUNTER — Encounter: Payer: Self-pay | Admitting: Family Medicine

## 2024-07-22 ENCOUNTER — Ambulatory Visit: Payer: Self-pay | Admitting: Internal Medicine

## 2024-07-22 ENCOUNTER — Ambulatory Visit (INDEPENDENT_AMBULATORY_CARE_PROVIDER_SITE_OTHER): Payer: Self-pay | Admitting: Family Medicine

## 2024-07-22 VITALS — BP 144/80 | HR 63 | Temp 98.5°F | Ht 70.08 in | Wt 237.0 lb

## 2024-07-22 DIAGNOSIS — I10 Essential (primary) hypertension: Secondary | ICD-10-CM

## 2024-07-22 DIAGNOSIS — Z0000 Encounter for general adult medical examination without abnormal findings: Secondary | ICD-10-CM | POA: Diagnosis not present

## 2024-07-22 DIAGNOSIS — E785 Hyperlipidemia, unspecified: Secondary | ICD-10-CM

## 2024-07-22 DIAGNOSIS — K4090 Unilateral inguinal hernia, without obstruction or gangrene, not specified as recurrent: Secondary | ICD-10-CM

## 2024-07-22 DIAGNOSIS — Z7189 Other specified counseling: Secondary | ICD-10-CM

## 2024-07-22 DIAGNOSIS — B009 Herpesviral infection, unspecified: Secondary | ICD-10-CM

## 2024-07-22 DIAGNOSIS — R202 Paresthesia of skin: Secondary | ICD-10-CM

## 2024-07-22 MED ORDER — VALACYCLOVIR HCL 500 MG PO TABS: 500.0000 mg | ORAL_TABLET | Freq: Every day | ORAL | 3 refills | Status: AC

## 2024-07-22 NOTE — Progress Notes (Unsigned)
 CPE- See plan.  Routine anticipatory guidance given to patient.  See health maintenance.  The possibility exists that previously documented standard health maintenance information may have been brought forward from a previous encounter into this note.  If needed, that same information has been updated to reflect the current situation based on today's encounter.    Tetanus 2015 Flu d/w pt.   PNA 2009 Shingles d/w pt.   covid vaccine d/w pt.  Colon cancer screening d/w pt, would address after he has cardiology results/plan.   Prostate cancer screening and PSA options (with potential risks and benefits of testing vs not testing) were discussed along with recent recs/guidelines.  He declined testing PSA at this point. Living will d/w pt.  Wife designated if patient were incapacitated.   Diet and exercise d/w pt.  HCV neg.  D/w pt.   HIV prev neg in ~1996 per patient report.   He still has hernia, not having pain now but had some pain a few weeks ago.  He is working to get his cardiac status addressed prior to hernia repair.    Hypertension:               Using medication without problems or lightheadedness: yes Chest pain with exertion: noted with sig exertion, ie lifting tires at work.  This is stable.  He limits lifting at work due to hernia.  Cardiology eval d/w pt.  Awaiting cardiology input.   Edema:no  Father was living with patient, he passed last year, he had Lewy Body dementia.     Elevated Cholesterol: Using medications without problems: yes Muscle aches: no Diet compliance: d/w pt.   Exercise: d/w pt.  Limited by hernia.     D/w pt about suppression with valtex, it helps.  No ADE on med.     He has not yet had CTS surgery.   PMH and SH reviewed  Meds, vitals, and allergies reviewed.   ROS: Per HPI.  Unless specifically indicated otherwise in HPI, the patient denies:  General: fever. Eyes: acute vision changes ENT: sore throat Cardiovascular: chest pain Respiratory:  SOB GI: vomiting GU: dysuria Musculoskeletal: acute back pain Derm: acute rash Neuro: acute motor dysfunction Psych: worsening mood Endocrine: polydipsia Heme: bleeding Allergy: hayfever  GEN: nad, alert and oriented HEENT: ncat NECK: supple w/o LA CV: rrr. PULM: ctab, no inc wob ABD: soft, +bs EXT: no edema SKIN: well perfused.

## 2024-07-22 NOTE — Patient Instructions (Addendum)
 Fasting labs when possible.  Ask the front for a lab appointment.    Let me know when you want to see GI about the colonoscopy.    Take care.  Glad to see you.

## 2024-07-23 NOTE — Assessment & Plan Note (Signed)
 D/w pt about suppression with valtex, it helps.  No ADE on med.  Continue as is.

## 2024-07-23 NOTE — Assessment & Plan Note (Signed)
 Living will d/w pt.  Wife designated if patient were incapacitated.   ?

## 2024-07-23 NOTE — Assessment & Plan Note (Signed)
 Cardiology eval d/w pt.  Awaiting cardiology input.   Continue amlodipine .

## 2024-07-23 NOTE — Assessment & Plan Note (Signed)
 Continue atorvastatin 

## 2024-07-23 NOTE — Assessment & Plan Note (Signed)
 Tetanus 2015 Flu d/w pt.   PNA 2009 Shingles d/w pt.   covid vaccine d/w pt.  Colon cancer screening d/w pt, would address after he has cardiology results/plan.   Prostate cancer screening and PSA options (with potential risks and benefits of testing vs not testing) were discussed along with recent recs/guidelines.  He declined testing PSA at this point. Living will d/w pt.  Wife designated if patient were incapacitated.   Diet and exercise d/w pt.  HCV neg.  D/w pt.   HIV prev neg in ~1996 per patient report.

## 2024-07-23 NOTE — Assessment & Plan Note (Signed)
 He is working to get his cardiac status addressed prior to hernia repair.  Discussed.

## 2024-07-24 ENCOUNTER — Other Ambulatory Visit: Payer: Self-pay | Admitting: *Deleted

## 2024-07-24 ENCOUNTER — Other Ambulatory Visit (INDEPENDENT_AMBULATORY_CARE_PROVIDER_SITE_OTHER)

## 2024-07-24 ENCOUNTER — Encounter: Payer: Self-pay | Admitting: *Deleted

## 2024-07-24 DIAGNOSIS — R9439 Abnormal result of other cardiovascular function study: Secondary | ICD-10-CM

## 2024-07-24 DIAGNOSIS — E785 Hyperlipidemia, unspecified: Secondary | ICD-10-CM

## 2024-07-24 DIAGNOSIS — R202 Paresthesia of skin: Secondary | ICD-10-CM | POA: Diagnosis not present

## 2024-07-24 LAB — CBC WITH DIFFERENTIAL/PLATELET
Basophils Absolute: 0 K/uL (ref 0.0–0.1)
Basophils Relative: 0.5 % (ref 0.0–3.0)
Eosinophils Absolute: 0.2 K/uL (ref 0.0–0.7)
Eosinophils Relative: 4.2 % (ref 0.0–5.0)
HCT: 42.2 % (ref 39.0–52.0)
Hemoglobin: 13.9 g/dL (ref 13.0–17.0)
Lymphocytes Relative: 27.7 % (ref 12.0–46.0)
Lymphs Abs: 1.3 K/uL (ref 0.7–4.0)
MCHC: 32.8 g/dL (ref 30.0–36.0)
MCV: 83.4 fl (ref 78.0–100.0)
Monocytes Absolute: 0.4 K/uL (ref 0.1–1.0)
Monocytes Relative: 8.8 % (ref 3.0–12.0)
Neutro Abs: 2.8 K/uL (ref 1.4–7.7)
Neutrophils Relative %: 58.8 % (ref 43.0–77.0)
Platelets: 204 K/uL (ref 150.0–400.0)
RBC: 5.06 Mil/uL (ref 4.22–5.81)
RDW: 14.5 % (ref 11.5–15.5)
WBC: 4.7 K/uL (ref 4.0–10.5)

## 2024-07-24 LAB — LIPID PANEL
Cholesterol: 127 mg/dL (ref 0–200)
HDL: 31.1 mg/dL — ABNORMAL LOW (ref >39.00)
LDL Cholesterol: 63 mg/dL (ref 0–99)
NonHDL: 95.76
Total CHOL/HDL Ratio: 4
Triglycerides: 163 mg/dL — ABNORMAL HIGH (ref 0.0–149.0)
VLDL: 32.6 mg/dL (ref 0.0–40.0)

## 2024-07-24 LAB — VITAMIN B12: Vitamin B-12: 241 pg/mL (ref 211–911)

## 2024-07-24 LAB — COMPREHENSIVE METABOLIC PANEL WITH GFR
ALT: 13 U/L (ref 0–53)
AST: 13 U/L (ref 0–37)
Albumin: 4.4 g/dL (ref 3.5–5.2)
Alkaline Phosphatase: 84 U/L (ref 39–117)
BUN: 13 mg/dL (ref 6–23)
CO2: 26 meq/L (ref 19–32)
Calcium: 9.5 mg/dL (ref 8.4–10.5)
Chloride: 104 meq/L (ref 96–112)
Creatinine, Ser: 0.84 mg/dL (ref 0.40–1.50)
GFR: 93.57 mL/min (ref >60.00)
Glucose, Bld: 105 mg/dL — ABNORMAL HIGH (ref 70–99)
Potassium: 4.2 meq/L (ref 3.5–5.1)
Sodium: 143 meq/L (ref 135–145)
Total Bilirubin: 0.6 mg/dL (ref 0.2–1.2)
Total Protein: 6.6 g/dL (ref 6.0–8.3)

## 2024-07-24 LAB — TSH: TSH: 0.64 u[IU]/mL (ref 0.35–5.50)

## 2024-07-26 LAB — NMR, LIPOPROFILE
Cholesterol, Total: 138 mg/dL (ref 100–199)
HDL Particle Number: 23.2 umol/L — ABNORMAL LOW (ref >=30.5)
HDL-C: 31 mg/dL — ABNORMAL LOW (ref >39)
LDL Particle Number: 915 nmol/L (ref <1000)
LDL Size: 20.2 nm — ABNORMAL LOW (ref >20.5)
LDL-C (NIH Calc): 79 mg/dL (ref 0–99)
LP-IR Score: 77 — ABNORMAL HIGH (ref <=45)
Small LDL Particle Number: 517 nmol/L (ref <=527)
Triglycerides: 160 mg/dL — ABNORMAL HIGH (ref 0–149)

## 2024-07-27 ENCOUNTER — Ambulatory Visit: Payer: Self-pay | Admitting: Family Medicine

## 2024-07-28 ENCOUNTER — Telehealth: Payer: Self-pay | Admitting: Internal Medicine

## 2024-07-28 ENCOUNTER — Ambulatory Visit (HOSPITAL_COMMUNITY)
Admission: RE | Admit: 2024-07-28 | Discharge: 2024-07-28 | Disposition: A | Discharge status: Home / Self Care | Attending: Internal Medicine | Admitting: Internal Medicine

## 2024-07-28 ENCOUNTER — Encounter (HOSPITAL_COMMUNITY)
Admission: RE | Disposition: A | Discharge status: Home / Self Care | Payer: Self-pay | Source: Home / Self Care | Attending: Internal Medicine

## 2024-07-28 ENCOUNTER — Other Ambulatory Visit: Payer: Self-pay

## 2024-07-28 DIAGNOSIS — I10 Essential (primary) hypertension: Secondary | ICD-10-CM | POA: Diagnosis not present

## 2024-07-28 DIAGNOSIS — I77819 Aortic ectasia, unspecified site: Secondary | ICD-10-CM | POA: Insufficient documentation

## 2024-07-28 DIAGNOSIS — I2510 Atherosclerotic heart disease of native coronary artery without angina pectoris: Secondary | ICD-10-CM | POA: Insufficient documentation

## 2024-07-28 DIAGNOSIS — R9430 Abnormal result of cardiovascular function study, unspecified: Secondary | ICD-10-CM

## 2024-07-28 DIAGNOSIS — R931 Abnormal findings on diagnostic imaging of heart and coronary circulation: Secondary | ICD-10-CM | POA: Insufficient documentation

## 2024-07-28 DIAGNOSIS — Z8249 Family history of ischemic heart disease and other diseases of the circulatory system: Secondary | ICD-10-CM | POA: Diagnosis not present

## 2024-07-28 DIAGNOSIS — E785 Hyperlipidemia, unspecified: Secondary | ICD-10-CM | POA: Insufficient documentation

## 2024-07-28 DIAGNOSIS — R9439 Abnormal result of other cardiovascular function study: Secondary | ICD-10-CM

## 2024-07-28 DIAGNOSIS — Z006 Encounter for examination for normal comparison and control in clinical research program: Secondary | ICD-10-CM

## 2024-07-28 HISTORY — PX: LEFT HEART CATH AND CORONARY ANGIOGRAPHY: CATH118249

## 2024-07-28 LAB — LIPOPROTEIN A (LPA): Lipoprotein (a): <10 nmol/L (ref <75)

## 2024-07-28 SURGERY — LEFT HEART CATH AND CORONARY ANGIOGRAPHY
Anesthesia: LOCAL

## 2024-07-28 MED ORDER — LABETALOL HCL 5 MG/ML IV SOLN: 10.0000 mg | INTRAVENOUS | Status: DC | PRN

## 2024-07-28 MED ORDER — MIDAZOLAM HCL 2 MG/2ML IJ SOLN
INTRAMUSCULAR | Status: DC | PRN
  Administered 2024-07-28 (×2): 1 mg via INTRAVENOUS

## 2024-07-28 MED ORDER — SODIUM CHLORIDE 0.9 % IV SOLN
250.0000 mL | INTRAVENOUS | Status: DC | PRN
Start: 2024-07-28 — End: 2024-07-28

## 2024-07-28 MED ORDER — ONDANSETRON HCL 4 MG/2ML IJ SOLN: 4.0000 mg | Freq: Four times a day (QID) | INTRAMUSCULAR | Status: DC | PRN

## 2024-07-28 MED ORDER — SODIUM CHLORIDE 0.9% FLUSH: 3.0000 mL | Freq: Two times a day (BID) | INTRAVENOUS | Status: DC

## 2024-07-28 MED ORDER — MIDAZOLAM HCL 2 MG/2ML IJ SOLN
INTRAMUSCULAR | Status: AC
  Filled 2024-07-28: qty 2

## 2024-07-28 MED ORDER — VERAPAMIL HCL 2.5 MG/ML IV SOLN
INTRAVENOUS | Status: AC
Start: 2024-07-28 — End: 2024-07-28
  Filled 2024-07-28: qty 2

## 2024-07-28 MED ORDER — LIDOCAINE HCL (PF) 1 % IJ SOLN
INTRAMUSCULAR | Status: DC | PRN
Start: 2024-07-28 — End: 2024-07-28
  Administered 2024-07-28 (×2): 2 mL

## 2024-07-28 MED ORDER — HEPARIN SODIUM (PORCINE) 1000 UNIT/ML IJ SOLN
INTRAMUSCULAR | Status: AC
  Filled 2024-07-28: qty 10

## 2024-07-28 MED ORDER — HYDRALAZINE HCL 20 MG/ML IJ SOLN: 10.0000 mg | INTRAMUSCULAR | Status: DC | PRN

## 2024-07-28 MED ORDER — FREE WATER: 500.0000 mL | Freq: Once | Status: DC

## 2024-07-28 MED ORDER — HEPARIN SODIUM (PORCINE) 1000 UNIT/ML IJ SOLN
INTRAMUSCULAR | Status: DC | PRN
  Administered 2024-07-28 (×2): 5000 [IU] via INTRAVENOUS

## 2024-07-28 MED ORDER — HEPARIN (PORCINE) IN NACL 1000-0.9 UT/500ML-% IV SOLN
INTRAVENOUS | Status: DC | PRN
  Administered 2024-07-28 (×4): 500 mL

## 2024-07-28 MED ORDER — SODIUM CHLORIDE 0.9% FLUSH: 3.0000 mL | INTRAVENOUS | Status: DC | PRN

## 2024-07-28 MED ORDER — ASPIRIN 81 MG PO CHEW: 81.0000 mg | CHEWABLE_TABLET | ORAL | Status: DC

## 2024-07-28 MED ORDER — LIDOCAINE HCL (PF) 1 % IJ SOLN
INTRAMUSCULAR | Status: AC
  Filled 2024-07-28: qty 30

## 2024-07-28 MED ORDER — ACETAMINOPHEN 325 MG PO TABS: 650.0000 mg | ORAL_TABLET | ORAL | Status: DC | PRN

## 2024-07-28 MED ORDER — IOHEXOL 350 MG/ML SOLN
INTRAVENOUS | Status: DC | PRN
  Administered 2024-07-28 (×2): 90 mL

## 2024-07-28 MED ORDER — FENTANYL CITRATE (PF) 100 MCG/2ML IJ SOLN
INTRAMUSCULAR | Status: AC
  Filled 2024-07-28: qty 2

## 2024-07-28 MED ORDER — SODIUM CHLORIDE 0.9 % IV SOLN: 250.0000 mL | INTRAVENOUS | Status: DC | PRN

## 2024-07-28 MED ORDER — VERAPAMIL HCL 2.5 MG/ML IV SOLN
INTRAVENOUS | Status: DC | PRN
  Administered 2024-07-28 (×2): 10 mL via INTRA_ARTERIAL

## 2024-07-28 MED ORDER — FENTANYL CITRATE (PF) 100 MCG/2ML IJ SOLN
INTRAMUSCULAR | Status: DC | PRN
  Administered 2024-07-28 (×2): 50 ug via INTRAVENOUS

## 2024-07-28 SURGICAL SUPPLY — 8 items
CATH 5FR JL3.5 JR4 ANG PIG MP (CATHETERS) IMPLANT
CATH INFINITI 5 FR MPA2 (CATHETERS) IMPLANT
DEVICE RAD COMP TR BAND LRG (VASCULAR PRODUCTS) IMPLANT
GLIDESHEATH SLEND SS 6F .021 (SHEATH) IMPLANT
GUIDEWIRE INQWIRE 1.5J.035X260 (WIRE) IMPLANT
KIT SYRINGE INJ CVI SPIKEX1 (MISCELLANEOUS) IMPLANT
PACK CARDIAC CATHETERIZATION (CUSTOM PROCEDURE TRAY) ×1 IMPLANT
SET ATX-X65L (MISCELLANEOUS) IMPLANT

## 2024-07-28 NOTE — Discharge Instructions (Signed)

## 2024-07-28 NOTE — Telephone Encounter (Signed)
 Pt spouse called in to ask if there is any f/u pt needs following heart cath today. Please advise.

## 2024-07-28 NOTE — Interval H&P Note (Signed)
 History and Physical Interval Note:  07/28/2024 8:55 AM  [name]  has presented today for surgery, with the diagnosis of positive stress test.  The various methods of treatment have been discussed with the patient and family. After consideration of risks, benefits and other options for treatment, the patient has consented to  Procedure(s): LEFT HEART CATH AND CORONARY ANGIOGRAPHY (N/A) as a surgical intervention.  The patient's history has been reviewed, patient examined, no change in status, stable for surgery.  I have reviewed the patient's chart and labs.  Questions were answered to the patient's satisfaction.    Cath Lab Visit (complete for each Cath Lab visit)  Clinical Evaluation Leading to the Procedure:   ACS: No.  Non-ACS:    Anginal Classification: CCS III  Anti-ischemic medical therapy: Minimal Therapy (1 class of medications)  Non-Invasive Test Results: Intermediate-risk stress test findings: cardiac mortality 1-3%/year  Prior CABG: No previous CABG  Demorio Seeley

## 2024-07-28 NOTE — Research (Addendum)
 Prevail Informed Consent   Subject Name: Erik [facility]  Subject met inclusion and exclusion criteria.  The informed consent form, study requirements and expectations were reviewed with the subject and questions and concerns were addressed prior to the signing of the consent form.  The subject verbalized understanding of the trial requirements.  The subject agreed to participate in the Prevail trial and signed the informed consent at 0730 on 07/28/2024.  The informed consent was obtained prior to performance of any protocol-specific procedures for the subject.  A copy of the signed informed consent was given to the subject and a copy was placed in the subject's medical record.   Kabria Hetzer    Screen fail

## 2024-07-28 NOTE — Progress Notes (Signed)
 Cardiology Office Note:  .   Date:  08/11/2024  ID:  Erik [facility], DOB 1961-05-04, MRN [id number] PCP: Cleatus Arlyss RAMAN, MD  [facility] Providers Cardiologist:  Vinie JAYSON Maxcy, MD    History of Present Illness: .   Erik [facility] is a 63 y.o. male with history significant for hypertension, dyslipidemia and bradycardiaHe underwent exercise treadmill stress testing in 1996 and a Myoview  stress test in 2003 which showed normal LVEF of 67%.He had CT coronary angiography which demonstrated a high calcium  score of 882, 97th percentile for age and sex matched control. His aorta was dilated up to 43 mm. A definitive cardiac catheterization was recommended. Cath was performed in December 2021 however only showed mild nonobstructive coronary disease with normal LV function. Subsequently aggressive medical therapy was recommended.  He saw Dr. Cecile for preop clearance but activity level couldn't be assessed so myoview  images - this shows a possible small area of reversible ischemia in the basal anterolateral wall - could be branch vessel disease such as diagonal or OM. Not a high risk study. He has had rest pain, but not active. LHC 07/28/24 stable CAD medical therapy recommended. Walking 45 min daily. Trying to watch his salt. BP up today. Is still in pain from his hernia repair.   ROS:    Studies Reviewed: SABRA    EKG Interpretation Date/Time:  Monday August 11 2024 09:35:36 EDT Ventricular Rate:  80 PR Interval:  160 QRS Duration:  86 QT Interval:  392 QTC Calculation: 452 R Axis:   1  Text Interpretation: Normal sinus rhythm Normal ECG When compared with ECG of 28-Jul-2024 08:12, Premature atrial complexes are no longer Present Confirmed by Erik [facility] [phone]) on 08/11/2024 9:42:34 AM    Prior CV Studies:   LHC 07/28/24 Conclusions: Stable appearance of coronary arteries since 2021 with mild proximal/mid LAD disease of up to 20-30%.  No significant disease noted in dominant LCx or  RCA. Normal left ventricular systolic function (LVEF > 65%) with normal filling pressure (LVEDP 10 mmHg).   Recommendations: Continue medical therapy and risk factor modification to prevent progression of mild CAD.   Lonni Hanson, MD [facility]  Risk Assessment/Calculations:     HYPERTENSION CONTROL Vitals:   08/11/24 0933 08/11/24 0955  BP: (!) 146/83 (!) 160/100    The patient's blood pressure is elevated above target today.  In order to address the patient's elevated BP: Blood pressure will be monitored at home to determine if medication changes need to be made.          Physical Exam:   VS:  BP (!) 160/100   Pulse 80   Ht 5' 10 (1.778 m)   Wt 230 lb (104.3 kg)   SpO2 98%   BMI 33.00 kg/m    Orhtostatics: No data found. Wt Readings from Last 3 Encounters:  08/11/24 230 lb (104.3 kg)  08/04/24 230 lb (104.3 kg)  07/28/24 230 lb (104.3 kg)    GEN: Well nourished, well developed in no acute distress NECK: No JVD; No carotid bruits CARDIAC:  RRR, no murmurs, rubs, gallops RESPIRATORY:  Clear to auscultation without rales, wheezing or rhonchi  ABDOMEN: Soft, non-tender, non-distended EXTREMITIES:  No edema; No deformity   ASSESSMENT AND PLAN: .   Preop clearance for inguinal  hernia repari Dr. 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 Risk factor reduction, continue atorvastatin   Family history of CAD  HTN-BP  running high, still in pain from hernia repair and taking ibuprofen. He'd like to check it at home for 2 weeks, lower sodium in diet and exercise before increasing or adding meds. I think this is reasonable.  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 Mona 6 months or sooner depending on BP readings.  Signed, Olivia Pavy, PA-C

## 2024-07-28 NOTE — Progress Notes (Signed)
 TR band removed, pt ambulated to the bathroom with staff, where he was able to void. Incision site remained C/D/I. No S/S of complications. Discharge instruction reviewed with patient and wife at bedside. Denies questions or concerns at this time. PT was escorted off the unit via staff in wheelchair to personal vehicle.

## 2024-07-29 ENCOUNTER — Encounter (HOSPITAL_COMMUNITY): Payer: Self-pay | Admitting: Internal Medicine

## 2024-07-31 ENCOUNTER — Ambulatory Visit: Payer: Self-pay | Admitting: Surgery

## 2024-07-31 NOTE — Progress Notes (Signed)
 The patient was identified using 2 approved identifiers. All issues noted in this document were discussed and addressed, Mr Erik [facility] voiced understanding and agreement with all preoperative instructions. The patient was emailed the surgery instructions per his request.      The patient was instructed to the Admitting Office [phone] or [phone]) to complete their Pre-surgical Interview.     COVID Vaccine received:  []  No [x]  Yes Date of any COVID positive Test in last 90 days:  PCP - Arlyss Solian, MD [phone] (Work) [phone] (Fax)  Cardiologist - K. [place] Hilty, MD   Chest x-ray -  EKG - 07-28-2024  Epic   Stress Test - Lexiscan  07-21-2024  Epic ECHO - 01-23-2019  Epic Cardiac Cath -07-28-2024  LHC / CORS by Dr. Mady  CT Coronary Calcium  score: 882 on 11-15-2020 Epic  Bowel Prep - [x]  No  []   Yes ______  Pacemaker / ICD device [x]  No []  Yes   Spinal Cord Stimulator:[x]  No []  Yes       History of Sleep Apnea? [x]  No []  Yes   CPAP used?- [x]  No []  Yes    Patient has: [x]  NO Hx DM   []  Pre-DM   []  DM1  []   DM2 Does the patient monitor blood sugar?   [x]  N/A   []  No []  Yes   Blood Thinner / Instructions:  none Aspirin  Instructions:   ASA 81 mg    ERAS Protocol Ordered: [x]  No  []  Yes Patient is to be NPO after: MN Prior  Dental hx: []  Dentures:  [x]  N/A      []  Bridge or Partial:                   []  Loose or Damaged teeth:   Comments: Patient had CMP, CBC diff. Drawn at his PCP's office 07-24-24. Lab values are in Epic  Activity level: Able to walk up 2 flights of stairs without becoming significantly short of breath or having chest pain?  []  No   [x]    Yes  Patient can perform ADLs without assistance. []  No   [x]   Yes  Anesthesia review: Non-obstructive CAD, HTN, PACs, GERD, anxiety  Patient denies any S&S of respiratory illness or Covid - no shortness of breath, fever, cough or chest pain at PAT appointment.  Patient verbalized understanding and agreement to  the Pre-Surgical Instructions that were given to them at this PAT appointment. Patient was also educated of the need to review these PAT instructions again prior to his surgery.I reviewed the appropriate phone numbers to call if they have any and questions or concerns.

## 2024-07-31 NOTE — Patient Instructions (Signed)
 SURGICAL WAITING ROOM VISITATION Patients having surgery or a procedure may have no more than 2 support people in the waiting area - these visitors may rotate in the visitor waiting room.   If the patient needs to stay at the hospital during part of their recovery, the visitor guidelines for inpatient rooms apply.  PRE-OP VISITATION  Pre-op nurse will coordinate an appropriate time for 1 support person to accompany the patient in pre-op.  This support person may not rotate.  This visitor will be contacted when the time is appropriate for the visitor to come back in the pre-op area.  Please refer to the [facility] website for the visitor guidelines for Inpatients (after your surgery is over and you are in a regular room).  You are not required to quarantine at this time prior to your surgery. However, you must do this: Hand Hygiene often Do NOT share personal items Notify your provider if you are in close contact with someone who has COVID or you develop fever 100.4 or greater, new onset of sneezing, cough, sore throat, shortness of breath or body aches.  If you test positive for Covid or have been in contact with anyone that has tested positive in the last 10 days please notify you surgeon.    Your procedure is scheduled on:  Monday  August 04, 2024  Report to [facility] Main Entrance: [address] entrance where the [employer] is available.   Report to admitting at: 05:15 AM  Call this number if you have any questions or problems the morning of surgery [phone]  DO NOT EAT OR DRINK ANYTHING AFTER MIDNIGHT THE NIGHT PRIOR TO YOUR SURGERY / PROCEDURE.   FOLLOW  ANY ADDITIONAL PRE OP INSTRUCTIONS YOU RECEIVED FROM YOUR SURGEON'S OFFICE!!!   Oral Hygiene is also important to reduce your risk of infection.        Remember - BRUSH YOUR TEETH THE MORNING OF SURGERY WITH YOUR REGULAR TOOTHPASTE  Do NOT smoke after Midnight the night before surgery.  STOP TAKING all Vitamins,  Herbs and supplements 1 week before your surgery.   Take ONLY these medicines the morning of surgery with A SIP OF WATER : None.  Please bring your Albuterol  inhaler with you on the day of surgery.                     You may not have any metal on your body including hair pins, jewelry, and body piercing  Do not wear lotions, powders,cologne, or deodorant  Men may shave face and neck.  Contacts, Hearing Aids, dentures or bridgework may not be worn into surgery. DENTURES WILL BE REMOVED PRIOR TO SURGERY PLEASE DO NOT APPLY Poly grip OR ADHESIVES!!!  Patients discharged on the day of surgery will not be allowed to drive home.  Someone NEEDS to stay with you for the first 24 hours after anesthesia.  Do not bring your home medications to the hospital. The Pharmacy will dispense medications listed on your medication list to you during your admission in the Hospital.  Please read over the following fact sheets you were given: IF YOU HAVE QUESTIONS ABOUT YOUR PRE-OP INSTRUCTIONS, PLEASE CALL [phone].   [place] - Preparing for Surgery Before surgery, you can play an important role.  Because skin is not sterile, your skin needs to be as free of germs as possible.  You can reduce the number of germs on your skin by washing with CHG (chlorahexidine gluconate) soap before  surgery.  CHG is an antiseptic cleaner which kills germs and bonds with the skin to continue killing germs even after washing. Please DO NOT use if you have an allergy to CHG or antibacterial soaps.  If your skin becomes reddened/irritated stop using the CHG and inform your nurse when you arrive at Short Stay. Do not shave (including legs and underarms) for at least 48 hours prior to the first CHG shower.  You may shave your face/neck.  Please follow these instructions carefully:  1.  Shower with CHG Soap the night before surgery and the  morning of surgery.  2.  If you choose to wash your hair, wash your hair first as  usual with your normal  shampoo.  3.  After you shampoo, rinse your hair and body thoroughly to remove the shampoo.                             4.  Use CHG as you would any other liquid soap.  You can apply chg directly to the skin and wash.  Gently with a scrungie or clean washcloth.  5.  Apply the CHG Soap to your body ONLY FROM THE NECK DOWN.   Do not use on face/ open                           Wound or open sores. Avoid contact with eyes, ears mouth and genitals (private parts).                       Wash face,  Genitals (private parts) with your normal soap.             6.  Wash thoroughly, paying special attention to the area where your  surgery  will be performed.  7.  Thoroughly rinse your body with warm water  from the neck down.  8.  DO NOT shower/wash with your normal soap after using and rinsing off the CHG Soap.            9.  Pat yourself dry with a clean towel.            10.  Wear clean pajamas.            11.  Place clean sheets on your bed the night of your first shower and do not  sleep with pets.  ON THE DAY OF SURGERY : Do not apply any lotions/deodorants the morning of surgery.  Please wear clean clothes to the hospital/surgery center.    FAILURE TO FOLLOW THESE INSTRUCTIONS MAY RESULT IN THE CANCELLATION OF YOUR SURGERY  PATIENT SIGNATURE_________________________________  NURSE SIGNATURE__________________________________  ________________________________________________________________________

## 2024-08-01 ENCOUNTER — Encounter (HOSPITAL_COMMUNITY)
Admission: RE | Admit: 2024-08-01 | Discharge: 2024-08-01 | Disposition: A | Discharge status: Home / Self Care | Source: Ambulatory Visit | Attending: Surgery

## 2024-08-01 ENCOUNTER — Encounter (HOSPITAL_COMMUNITY): Payer: Self-pay

## 2024-08-01 HISTORY — DX: Atherosclerotic heart disease of native coronary artery without angina pectoris: I25.10

## 2024-08-01 HISTORY — DX: Other complications of anesthesia, initial encounter: T88.59XA

## 2024-08-01 HISTORY — DX: Cardiac arrhythmia, unspecified: I49.9

## 2024-08-01 NOTE — Progress Notes (Signed)
 Case: [id number] Date/Time: 08/04/24 0715   Procedure: REPAIR, HERNIA, INGUINAL, ADULT (Right)   Anesthesia type: General   Diagnosis: Non-recurrent inguinal hernia without obstruction or gangrene, unspecified laterality [K40.90]   Pre-op diagnosis: LARGE INGUINOSCROTAL HERNIA   Location: [facility] ROOM 04 / [facility]   Surgeons: Erik Mitzie LABOR, MD       DISCUSSION: Erik [facility] is a 63 yo male with PMH of HTN, CAD, TAA (4.3 cm), bradycardia, GERD, anxiety.  Patient follows with cardiology for history of CAD.  Seen in clinic by Dr. Mona on 07/09/2024.  He underwent a stress test due to reported chest pain which was abnormal.  He then underwent cardiac cath on 07/28/2024 which showed mild nonobstructive disease.  Ongoing medical therapy recommended.  Patient was cleared for upcoming surgery (in 07/28/2024 telephone note): Cath showed no significant obstructive CAD - would be ok to proceed with upcoming hernia repair at acceptable risk.  VS:  Wt Readings from Last 3 Encounters:  07/28/24 104.3 kg  07/22/24 107.5 kg  07/09/24 105.2 kg   Temp Readings from Last 3 Encounters:  07/28/24 36.9 C (Oral)  07/22/24 36.9 C (Oral)  04/02/23 36.6 C (Temporal)   BP Readings from Last 3 Encounters:  07/28/24 (!) 160/97  07/22/24 (!) 144/80  07/09/24 (!) 126/90   Pulse Readings from Last 3 Encounters:  07/28/24 (!) 56  07/22/24 63  07/09/24 (!) 58     PROVIDERS: Erik Arlyss RAMAN, MD   LABS: Labs reviewed: Acceptable for surgery. (all labs ordered are listed, but only abnormal results are displayed)  Labs Reviewed - No data to display   IMAGES:   EKG 07/28/24:  Sinus rhythm with Premature atrial complexes Prolonged QT Abnormal ECG When compared with ECG of 09-Jul-2024 13:26, QT has lengthened  CV: Left heart cath 07/28/2024:  Conclusions: Stable appearance of coronary arteries since 2021 with mild proximal/mid LAD disease of up to 20-30%.  No significant disease noted in  dominant LCx or RCA. Normal left ventricular systolic function (LVEF > 65%) with normal filling pressure (LVEDP 10 mmHg).   Recommendations: Continue medical therapy and risk factor modification to prevent progression of mild CAD.  Stress test 07/21/2024:    Findings are consistent with ischemia. The study is intermediate risk.   No ST deviation was noted.   LV perfusion is abnormal. There is evidence of ischemia. Defect 1: There is a medium defect with mild reduction in uptake present in the mid to basal anterior, anterolateral and anteroseptal location(s) that is partially reversible. There is normal wall motion in the defect area. Consistent with ischemia.   Left ventricular function is normal. Nuclear stress EF: 68%. The left ventricular ejection fraction is hyperdynamic (>65%). End diastolic cavity size is normal. End systolic cavity size is normal.   CT images were obtained for attenuation correction and were examined for the presence of coronary calcium  when appropriate.   Coronary calcium  was present on the attenuation correction CT images. Severe coronary calcifications were present.   Prior study available for comparison from 02/02/2002. There are changes compared to prior study.   Mildly decreased perfusion uptake in the mid to basal anteroseptal, anterior, and anterolateral walls on both attenuation corrected and non attenuation corrected images, concerning for ischemia.  Echo 01/23/2019:  IMPRESSIONS    1. The left ventricle has normal systolic function of 60-65%. The cavity size was normal. There is mildly increased left ventricular wall thickness. Echo evidence of normal diastolic relaxation.  2. The right ventricle  has normal systolic function. The cavity was normal. There is no increase in right ventricular wall thickness. Right ventricular systolic pressure normal with an estimated pressure of 21.7 mmHg.  3. Left atrial size was mildly dilated.  4. The mitral valve is  normal in structure.  5. The tricuspid valve is normal in structure.  6. The aortic valve is normal in structure.  7. The pulmonic valve was normal in structure.  8. There is mild dilatation of the ascending aorta.  Past Medical History:  Diagnosis Date   Allergic rhinitis 02/16/2003   Allergy    Anxiety    GERD (gastroesophageal reflux disease) 12/18/2002   H/O cold sores    valtrex  suppression   History of ETT 11/96   wnl, cardiology consult0 [place] 01/97   Hyperlipemia 08/19/1995   Hypertension 08/19/95   Shingles     Past Surgical History:  Procedure Laterality Date   LEFT HEART CATH AND CORONARY ANGIOGRAPHY N/A 11/23/2020   Procedure: LEFT HEART CATH AND CORONARY ANGIOGRAPHY;  Surgeon: Erik Mitchell, Erik M, MD;  Location: [facility] INVASIVE CV LAB;  Service: Cardiovascular;  Laterality: N/A;   LEFT HEART CATH AND CORONARY ANGIOGRAPHY N/A 07/28/2024   Procedure: LEFT HEART CATH AND CORONARY ANGIOGRAPHY;  Surgeon: Erik Bruckner, MD;  Location: [facility] INVASIVE CV LAB;  Service: Cardiovascular;  Laterality: N/A;   TENDON REPAIR     R 4th finger   TONSILLECTOMY      MEDICATIONS:  albuterol  (VENTOLIN  HFA) 108 (90 Base) MCG/ACT inhaler   amLODipine  (NORVASC ) 5 MG tablet   Ascorbic Acid (VITAMIN C PO)   aspirin  81 MG tablet   atorvastatin  (LIPITOR) 40 MG tablet   cholecalciferol (VITAMIN D3) 25 MCG (1000 UNIT) tablet   esomeprazole  (NEXIUM ) 40 MG capsule   nitroGLYCERIN  (NITROSTAT ) 0.4 MG SL tablet   valACYclovir  (VALTREX ) 500 MG tablet   vitamin E 180 MG (400 UNITS) capsule   zinc gluconate 50 MG tablet   No current facility-administered medications for this encounter.   Erik Mitchell Erik Mitchell Erik Mitchell [facility] Short Stay/Anesthesiology [facility] Phone [phone] 08/01/2024 2:03 PM

## 2024-08-01 NOTE — Anesthesia Preprocedure Evaluation (Addendum)
 Anesthesia Evaluation  Patient identified by MRN, date of birth, ID band Patient awake    Reviewed: Allergy & Precautions, H&P , NPO status , Patient's Chart, lab work & pertinent test results  Airway Mallampati: III  TM Distance: >3 FB Neck ROM: Full    Dental no notable dental hx. (+) Teeth Intact, Dental Advisory Given   Pulmonary neg pulmonary ROS   Pulmonary exam normal breath sounds clear to auscultation       Cardiovascular Exercise Tolerance: Good hypertension, Pt. on medications + CAD   Rhythm:Regular Rate:Normal     Neuro/Psych   Anxiety     negative neurological ROS     GI/Hepatic Neg liver ROS,GERD  Medicated,,  Endo/Other  negative endocrine ROS    Renal/GU negative Renal ROS  negative genitourinary   Musculoskeletal   Abdominal   Peds  Hematology negative hematology ROS (+)   Anesthesia Other Findings   Reproductive/Obstetrics negative OB ROS                              Anesthesia Physical Anesthesia Plan  ASA: 2  Anesthesia Plan: General   Post-op Pain Management: Tylenol  PO (pre-op)* and Toradol IV (intra-op)*   Induction: Intravenous  PONV Risk Score and Plan: 3 and Ondansetron , Dexamethasone  and Midazolam   Airway Management Planned: LMA and Oral ETT  Additional Equipment:   Intra-op Plan:   Post-operative Plan: Extubation in OR  Informed Consent: I have reviewed the patients History and Physical, chart, labs and discussed the procedure including the risks, benefits and alternatives for the proposed anesthesia with the patient or authorized representative who has indicated his/her understanding and acceptance.     Dental advisory given  Plan Discussed with: CRNA  Anesthesia Plan Comments: (See PAT note from 8/14)         Anesthesia Quick Evaluation

## 2024-08-01 NOTE — Telephone Encounter (Signed)
 Thanks! I've got him scheduled for surgery Monday.

## 2024-08-04 ENCOUNTER — Other Ambulatory Visit: Payer: Self-pay

## 2024-08-04 ENCOUNTER — Encounter (HOSPITAL_COMMUNITY): Payer: Self-pay | Admitting: Surgery

## 2024-08-04 ENCOUNTER — Ambulatory Visit (HOSPITAL_COMMUNITY)
Admission: RE | Admit: 2024-08-04 | Discharge: 2024-08-04 | Disposition: A | Discharge status: Home / Self Care | Attending: Surgery | Admitting: Surgery

## 2024-08-04 ENCOUNTER — Ambulatory Visit (HOSPITAL_BASED_OUTPATIENT_CLINIC_OR_DEPARTMENT_OTHER): Payer: Self-pay | Admitting: Anesthesiology

## 2024-08-04 ENCOUNTER — Encounter (HOSPITAL_COMMUNITY)
Admission: RE | Disposition: A | Discharge status: Home / Self Care | Payer: Self-pay | Source: Home / Self Care | Attending: Surgery

## 2024-08-04 ENCOUNTER — Encounter (HOSPITAL_COMMUNITY): Payer: Self-pay | Admitting: Medical

## 2024-08-04 DIAGNOSIS — K4030 Unilateral inguinal hernia, with obstruction, without gangrene, not specified as recurrent: Secondary | ICD-10-CM

## 2024-08-04 DIAGNOSIS — K4090 Unilateral inguinal hernia, without obstruction or gangrene, not specified as recurrent: Secondary | ICD-10-CM | POA: Diagnosis present

## 2024-08-04 DIAGNOSIS — I10 Essential (primary) hypertension: Secondary | ICD-10-CM | POA: Diagnosis not present

## 2024-08-04 DIAGNOSIS — K219 Gastro-esophageal reflux disease without esophagitis: Secondary | ICD-10-CM | POA: Insufficient documentation

## 2024-08-04 DIAGNOSIS — Z79899 Other long term (current) drug therapy: Secondary | ICD-10-CM | POA: Insufficient documentation

## 2024-08-04 DIAGNOSIS — Z8249 Family history of ischemic heart disease and other diseases of the circulatory system: Secondary | ICD-10-CM | POA: Diagnosis not present

## 2024-08-04 DIAGNOSIS — I2510 Atherosclerotic heart disease of native coronary artery without angina pectoris: Secondary | ICD-10-CM | POA: Insufficient documentation

## 2024-08-04 HISTORY — PX: INGUINAL HERNIA REPAIR: SHX194

## 2024-08-04 SURGERY — REPAIR, HERNIA, INGUINAL, ADULT
Anesthesia: General | Laterality: Right

## 2024-08-04 MED ORDER — LIDOCAINE HCL (PF) 2 % IJ SOLN
INTRAMUSCULAR | Status: AC
  Filled 2024-08-04: qty 5

## 2024-08-04 MED ORDER — PROPOFOL 10 MG/ML IV BOLUS
INTRAVENOUS | Status: AC
  Filled 2024-08-04: qty 20

## 2024-08-04 MED ORDER — FENTANYL CITRATE (PF) 100 MCG/2ML IJ SOLN
INTRAMUSCULAR | Status: AC
Start: 2024-08-04 — End: 2024-08-04
  Filled 2024-08-04: qty 2

## 2024-08-04 MED ORDER — ROCURONIUM BROMIDE 10 MG/ML (PF) SYRINGE
PREFILLED_SYRINGE | INTRAVENOUS | Status: DC | PRN
  Administered 2024-08-04: 50 mg via INTRAVENOUS
  Administered 2024-08-04: 10 mg via INTRAVENOUS
  Administered 2024-08-04: 20 mg via INTRAVENOUS
  Administered 2024-08-04: 10 mg via INTRAVENOUS

## 2024-08-04 MED ORDER — ONDANSETRON HCL 4 MG/2ML IJ SOLN
INTRAMUSCULAR | Status: DC | PRN
  Administered 2024-08-04: 4 mg via INTRAVENOUS

## 2024-08-04 MED ORDER — BUPIVACAINE LIPOSOME 1.3 % IJ SUSP: 20.0000 mL | Freq: Once | INTRAMUSCULAR | Status: DC

## 2024-08-04 MED ORDER — LACTATED RINGERS IV SOLN: INTRAVENOUS | Status: DC

## 2024-08-04 MED ORDER — ACETAMINOPHEN 500 MG PO TABS: 1000.0000 mg | ORAL_TABLET | Freq: Once | ORAL | Status: DC

## 2024-08-04 MED ORDER — ORAL CARE MOUTH RINSE: 15.0000 mL | Freq: Once | OROMUCOSAL | Status: AC

## 2024-08-04 MED ORDER — DOCUSATE SODIUM 100 MG PO CAPS: 100.0000 mg | ORAL_CAPSULE | Freq: Two times a day (BID) | ORAL | 0 refills | Status: AC

## 2024-08-04 MED ORDER — FENTANYL CITRATE (PF) 100 MCG/2ML IJ SOLN
INTRAMUSCULAR | Status: AC
  Filled 2024-08-04: qty 2

## 2024-08-04 MED ORDER — 0.9 % SODIUM CHLORIDE (POUR BTL) OPTIME
TOPICAL | Status: DC | PRN
Start: 2024-08-04 — End: 2024-08-04
  Administered 2024-08-04: 1000 mL

## 2024-08-04 MED ORDER — BUPIVACAINE LIPOSOME 1.3 % IJ SUSP
INTRAMUSCULAR | Status: DC | PRN
  Administered 2024-08-04: 50 mL

## 2024-08-04 MED ORDER — SUGAMMADEX SODIUM 200 MG/2ML IV SOLN
INTRAVENOUS | Status: DC | PRN
  Administered 2024-08-04: 200 mg via INTRAVENOUS

## 2024-08-04 MED ORDER — PHENYLEPHRINE 80 MCG/ML (10ML) SYRINGE FOR IV PUSH (FOR BLOOD PRESSURE SUPPORT)
PREFILLED_SYRINGE | INTRAVENOUS | Status: AC
  Filled 2024-08-04: qty 10

## 2024-08-04 MED ORDER — MIDAZOLAM HCL 2 MG/2ML IJ SOLN
INTRAMUSCULAR | Status: AC
Start: 2024-08-04 — End: 2024-08-04
  Filled 2024-08-04: qty 2

## 2024-08-04 MED ORDER — GABAPENTIN 300 MG PO CAPS
300.0000 mg | ORAL_CAPSULE | ORAL | Status: AC
  Administered 2024-08-04: 300 mg via ORAL
  Filled 2024-08-04: qty 1

## 2024-08-04 MED ORDER — ONDANSETRON HCL 4 MG/2ML IJ SOLN
INTRAMUSCULAR | Status: AC
  Filled 2024-08-04: qty 2

## 2024-08-04 MED ORDER — OXYCODONE HCL 5 MG PO TABS: 5.0000 mg | ORAL_TABLET | Freq: Three times a day (TID) | ORAL | 0 refills | Status: AC | PRN

## 2024-08-04 MED ORDER — CEFAZOLIN SODIUM-DEXTROSE 2-4 GM/100ML-% IV SOLN
2.0000 g | INTRAVENOUS | Status: AC
  Administered 2024-08-04: 2 g via INTRAVENOUS
  Filled 2024-08-04: qty 100

## 2024-08-04 MED ORDER — ROCURONIUM BROMIDE 10 MG/ML (PF) SYRINGE
PREFILLED_SYRINGE | INTRAVENOUS | Status: AC
  Filled 2024-08-04: qty 10

## 2024-08-04 MED ORDER — PHENYLEPHRINE HCL-NACL 20-0.9 MG/250ML-% IV SOLN
INTRAVENOUS | Status: DC | PRN
  Administered 2024-08-04: 20 ug/min via INTRAVENOUS

## 2024-08-04 MED ORDER — CHLORHEXIDINE GLUCONATE 0.12 % MT SOLN
15.0000 mL | Freq: Once | OROMUCOSAL | Status: AC
  Administered 2024-08-04: 15 mL via OROMUCOSAL

## 2024-08-04 MED ORDER — CHLORHEXIDINE GLUCONATE 4 % EX SOLN: 60.0000 mL | Freq: Once | CUTANEOUS | Status: DC

## 2024-08-04 MED ORDER — FENTANYL CITRATE (PF) 100 MCG/2ML IJ SOLN
INTRAMUSCULAR | Status: DC | PRN
  Administered 2024-08-04: 100 ug via INTRAVENOUS
  Administered 2024-08-04: 50 ug via INTRAVENOUS
  Administered 2024-08-04 (×2): 25 ug via INTRAVENOUS

## 2024-08-04 MED ORDER — DEXAMETHASONE SODIUM PHOSPHATE 10 MG/ML IJ SOLN
INTRAMUSCULAR | Status: AC
  Filled 2024-08-04: qty 1

## 2024-08-04 MED ORDER — PROPOFOL 10 MG/ML IV BOLUS
INTRAVENOUS | Status: DC | PRN
  Administered 2024-08-04: 200 mg via INTRAVENOUS

## 2024-08-04 MED ORDER — HYDROMORPHONE HCL 1 MG/ML IJ SOLN: 0.2500 mg | INTRAMUSCULAR | Status: DC | PRN

## 2024-08-04 MED ORDER — SUGAMMADEX SODIUM 200 MG/2ML IV SOLN
INTRAVENOUS | Status: AC
  Filled 2024-08-04: qty 2

## 2024-08-04 MED ORDER — MIDAZOLAM HCL 5 MG/5ML IJ SOLN
INTRAMUSCULAR | Status: DC | PRN
  Administered 2024-08-04: 2 mg via INTRAVENOUS

## 2024-08-04 MED ORDER — LIDOCAINE HCL (PF) 2 % IJ SOLN
INTRAMUSCULAR | Status: DC | PRN
Start: 2024-08-04 — End: 2024-08-04
  Administered 2024-08-04: 60 mg via INTRADERMAL

## 2024-08-04 MED ORDER — BUPIVACAINE-EPINEPHRINE (PF) 0.25% -1:200000 IJ SOLN
INTRAMUSCULAR | Status: AC
  Filled 2024-08-04: qty 30

## 2024-08-04 MED ORDER — BUPIVACAINE LIPOSOME 1.3 % IJ SUSP
INTRAMUSCULAR | Status: AC
  Filled 2024-08-04: qty 20

## 2024-08-04 MED ORDER — ACETAMINOPHEN 500 MG PO TABS
1000.0000 mg | ORAL_TABLET | ORAL | Status: AC
  Administered 2024-08-04: 1000 mg via ORAL
  Filled 2024-08-04: qty 2

## 2024-08-04 MED ORDER — DEXAMETHASONE SODIUM PHOSPHATE 10 MG/ML IJ SOLN
INTRAMUSCULAR | Status: DC | PRN
Start: 2024-08-04 — End: 2024-08-04
  Administered 2024-08-04: 10 mg via INTRAVENOUS

## 2024-08-04 MED ORDER — PHENYLEPHRINE 80 MCG/ML (10ML) SYRINGE FOR IV PUSH (FOR BLOOD PRESSURE SUPPORT)
PREFILLED_SYRINGE | INTRAVENOUS | Status: DC | PRN
  Administered 2024-08-04 (×6): 40 ug via INTRAVENOUS

## 2024-08-04 SURGICAL SUPPLY — 30 items
BAG COUNTER SPONGE SURGICOUNT (BAG) IMPLANT
BENZOIN TINCTURE PRP APPL 2/3 (GAUZE/BANDAGES/DRESSINGS) ×1 IMPLANT
BLADE SURG 15 STRL LF DISP TIS (BLADE) ×1 IMPLANT
CHLORAPREP W/TINT 26 (MISCELLANEOUS) ×1 IMPLANT
CLSR STERI-STRIP ANTIMIC 1/2X4 (GAUZE/BANDAGES/DRESSINGS) IMPLANT
COVER SURGICAL LIGHT HANDLE (MISCELLANEOUS) ×1 IMPLANT
DRAIN PENROSE 0.5X18 (DRAIN) ×1 IMPLANT
DRAPE LAPAROSCOPIC ABDOMINAL (DRAPES) ×1 IMPLANT
ELECT REM PT RETURN 15FT ADLT (MISCELLANEOUS) ×1 IMPLANT
GAUZE SPONGE 4X4 12PLY STRL (GAUZE/BANDAGES/DRESSINGS) IMPLANT
GLOVE BIO SURGEON STRL SZ 6 (GLOVE) ×1 IMPLANT
GLOVE INDICATOR 6.5 STRL GRN (GLOVE) ×1 IMPLANT
GOWN STRL REUS W/ TWL LRG LVL3 (GOWN DISPOSABLE) ×1 IMPLANT
KIT BASIN OR (CUSTOM PROCEDURE TRAY) ×1 IMPLANT
KIT TURNOVER KIT A (KITS) ×1 IMPLANT
MARKER SKIN DUAL TIP RULER LAB (MISCELLANEOUS) ×1 IMPLANT
MESH ULTRAPRO 3X6 7.6X15CM (Mesh General) IMPLANT
NDL HYPO 22X1.5 SAFETY MO (MISCELLANEOUS) ×1 IMPLANT
NEEDLE HYPO 22X1.5 SAFETY MO (MISCELLANEOUS) ×1 IMPLANT
PACK GENERAL/GYN (CUSTOM PROCEDURE TRAY) ×1 IMPLANT
PENCIL SMOKE EVACUATOR (MISCELLANEOUS) IMPLANT
SPIKE FLUID TRANSFER (MISCELLANEOUS) ×1 IMPLANT
STRIP CLOSURE SKIN 1/2X4 (GAUZE/BANDAGES/DRESSINGS) ×1 IMPLANT
SUT ETHIBOND 0 MO6 C/R (SUTURE) ×1 IMPLANT
SUT MNCRL AB 4-0 PS2 18 (SUTURE) ×1 IMPLANT
SUT PDS AB 0 CT1 36 (SUTURE) ×2 IMPLANT
SUT VIC AB 3-0 SH 27XBRD (SUTURE) ×2 IMPLANT
SUT VICRYL 3 0 BR 18 UND (SUTURE) ×1 IMPLANT
SYR CONTROL 10ML LL (SYRINGE) ×1 IMPLANT
TOWEL OR 17X26 10 PK STRL BLUE (TOWEL DISPOSABLE) ×1 IMPLANT

## 2024-08-04 NOTE — Anesthesia Postprocedure Evaluation (Signed)
 Anesthesia Post Note  Patient: Erik [facility]  Procedure(s) Performed: REPAIR, HERNIA, INGUINAL, ADULT (Right)     Patient location during evaluation: PACU Anesthesia Type: General Level of consciousness: awake and alert Pain management: pain level controlled Vital Signs Assessment: post-procedure vital signs reviewed and stable Respiratory status: spontaneous breathing, nonlabored ventilation and respiratory function stable Cardiovascular status: blood pressure returned to baseline and stable Postop Assessment: no apparent nausea or vomiting Anesthetic complications: no   No notable events documented.  Last Vitals:  Vitals:   08/04/24 1030 08/04/24 1042  BP: 136/89 (!) 143/100  Pulse: 75   Resp: 11 16  Temp: 36.6 C 36.6 C  SpO2: 100%     Last Pain:  Vitals:   08/04/24 1042  TempSrc: Oral  PainSc: 0-No pain                 Diana Armijo,W. EDMOND

## 2024-08-04 NOTE — H&P (Signed)
 Erik Mitchell    Referring Provider:  Self     Subjective    Chief Complaint: Follow-up ( [facility], DISCUSS SX)       History of Present Illness: Returns to discuss surgery for Erik Mitchell known right inguinal hernia.  I last saw him about a year ago at which time plans were made to proceed with surgery, but due to work and other issues he ended up not pursuing this.  In the interim, the hernia has become significantly larger.  Notes he has not seen Erik Mitchell cardiologist since before our last visit.     04/27/23: Very pleasant 63 year old male with history of anxiety, GERD, hyperlipidemia, hypertension, coronary artery disease status post left heart cath-last saw Dr. Mona little over a month ago.  Presents with right inguinal hernia.  He first noticed this about 6 months ago.  This is mildly painful at rest but more so when he is lifting at work.  Has noted increase in size over time.  No associated GI or urinary symptoms.   He works as a Human resources officer for [employer].     Review of Systems: A complete review of systems was obtained from the patient.  I have reviewed this information and discussed as appropriate with the patient.  See HPI as well for other ROS.     Medical History:     Past Medical History:  Diagnosis Date   Anxiety     GERD (gastroesophageal reflux disease)     Hyperlipidemia     Hypertension        There is no problem list on file for this patient.          Past Surgical History:  Procedure Laterality Date   left heart cath and coronary angiography   2001   Left heart cath and coronary angiography       REPAIR EXTENSOR TENDON FINGER Right     Tendon repair        R 4th finger   TONSILLECTOMY       TONSILLECTOMY               Allergies  Allergen Reactions   Venom-Honey Bee Other (See Comments)      Swelling, short of breath   Codeine Unknown   Meloxicam  Other (See Comments)      GI Upset   Naproxen  Other (See Comments)      GI Upset             Current Outpatient Medications on File Prior to Visit  Medication Sig Dispense Refill   amLODIPine  (NORVASC ) 5 MG tablet TAKE 1/2 TO 1 TABLET BY MOUTH EVERY DAY       ascorbic acid (VITAMIN C ORAL) Take by mouth       aspirin  81 MG EC tablet Take 81 mg by mouth at bedtime       atorvastatin  (LIPITOR) 40 MG tablet Take 1 tablet by mouth at bedtime       esomeprazole  (NEXIUM ) 40 MG DR capsule Take 1 capsule by mouth once daily       nitroGLYcerin  (NITROSTAT ) 0.4 MG SL tablet PLACE 1 TABLET UNDER THE TONGUE EVERY 5 (FIVE) MINUTES AS NEEDED FOR CHEST PAIN (MAX 3 DOSES).       valACYclovir  (VALTREX ) 500 MG tablet Take 1 tablet by mouth once daily       vitamin E 400 UNIT capsule Take 400 Units by mouth once daily  zinc gluconate 50 mg tablet Take 50 mg by mouth at bedtime       cholecalciferol 1000 unit tablet Take by mouth        No current facility-administered medications on file prior to visit.           Family History  Problem Relation Age of Onset   Colon cancer Mother     Coronary Artery Disease (Blocked arteries around heart) Mother     Diabetes Mother     Coronary Artery Disease (Blocked arteries around heart) Father     Coronary Artery Disease (Blocked arteries around heart) Brother     Diabetes Brother        Social History       Tobacco Use  Smoking Status Never  Smokeless Tobacco Never      Social History        Socioeconomic History   Marital status: Married  Tobacco Use   Smoking status: Never   Smokeless tobacco: Never  Substance and Sexual Activity   Alcohol use: Yes   Drug use: Never    Social Drivers of Health        Housing Stability: Unknown (06/27/2024)    Housing Stability Vital Sign     Homeless in the Last Year: No      Objective:         Vitals:    06/27/24 1119  PainSc:   2      There is no height or weight on file to calculate BMI.   Gen: A&Ox3, no distress  Unlabored respirations Abdomen is soft, nontender  nondistended.  Large inguinoscrotal right inguinal hernia, not reducible at this time.   Assessment and Plan:  Diagnoses and all orders for this visit:   Non-recurrent unilateral inguinal hernia without obstruction or gangrene Comments: Has progressed to a large, inguinoscrotal, chronically incarcerated hernia       We again discussed the relevant anatomy and we discussed options for repair.  I recommend an open approach and went over the technique of the procedure.  Discussed risks of bleeding, infection, pain, scarring, injury to structures in the area including nerves, blood vessels, bowel, bladder, risk of chronic pain, hernia recurrence, risk of seroma or hematoma, urinary retention, and risks of general anesthesia including cardiovascular, pulmonary, and thromboembolic complications.  We discussed typical postop recovery, timeline, and activity limitations.  Reviewed symptoms that should prompt him to seek emergency treatment between now and surgery.  Questions were welcomed and answered to the patient's satisfaction. Patient wishes to proceed with scheduling.  Will request cardiac clearance and hopefully can get surgery scheduled ASAP.     Bhavya Grand ALAN FREUND, MD

## 2024-08-04 NOTE — Transfer of Care (Signed)
 Immediate Anesthesia Transfer of Care Note  Patient: Erik Mitchell  Procedure(s) Performed: REPAIR, HERNIA, INGUINAL, ADULT (Right)  Patient Location: PACU  Anesthesia Type:General  Level of Consciousness: awake, alert , and oriented  Airway & Oxygen Therapy: Patient Spontanous Breathing and Patient connected to face mask oxygen  Post-op Assessment: Report given to RN and Post -op Vital signs reviewed and stable  Post vital signs: Reviewed and stable  Last Vitals:  Vitals Value Taken Time  BP 144/89 08/04/24 09:47  Temp    Pulse 71 08/04/24 09:49  Resp 14 08/04/24 09:49  SpO2 100 % 08/04/24 09:49  Vitals shown include unfiled device data.  Last Pain:  Vitals:   08/04/24 0558  TempSrc:   PainSc: 0-No pain         Complications: No notable events documented.

## 2024-08-04 NOTE — Op Note (Signed)
 Operative Note  MARRELL DICAPRIO  [id number]  [id number]  08/04/2024   Surgeon: Mitzie DELENA Freund MD FACS   Procedure performed: Open right inguinal hernia repair with mesh   Preop diagnosis: Chronically incarcerated large right inguinal-scrotal hernia   Post-op diagnosis/intraop findings: Massive chronically incarcerated right indirect inguinal hernia containing bowel and omentum, completely disrupted inguinal floor   Specimens: none   EBL: 10cc   Complications: none   Description of procedure: After confirming informed consent, the patient was taken to the operating room and placed supine on the operating room table where general anesthesia was initiated, preoperative antibiotics were administered, SCDs applied, and a formal timeout was performed. The groin was clipped, prepped and draped in the usual sterile fashion. An oblique incision was made the just above the inguinal ligament after infiltrating the tissues with local anesthetic (Exparel  mixed with 0.25% Marcaine  with epinephrine ). Soft tissues were dissected using electrocautery until the external oblique aponeurosis was encountered. This was divided sharply to expand the external ring. A plane was bluntly developed beneath the external oblique. The spermatic cord was then bluntly dissected away from the pubic tubercle and encircled with a Penrose. Inspection of the inguinal anatomy revealed a massive inguinoscrotal sac with dense adhesions to the spermatic cord as well as overlying cremasterics.  A fairly tedious dissection ensued, carefully freeing the hernia sac from the cord structures.  There was palpable bowel and omentum within the hernia sac chronically and this was gradually able to be reduced as the dissection ensued, affording more working space in the field.  Ultimately the indirect hernia sac was able to be dissected away from the cord structures and skeletonized to the level of the internal ring, where it was reduced intact into  the abdomen.  The inguinal floor was essentially completely disrupted.  The inguinal floor was reconstructed suturing the conjoint tendon to the inguinal ligament with interrupted 0 PDS, leaving an internal ring just sufficient for the cord structures and maintaining the preperitoneal tissues and hernia sac reduced to the abdominal cavity.  A 3 x 6 piece of ultra Pro mesh was brought onto the field and trimmed slightly to approximate the field. This was sutured to the pubic tubercle fascia, inferior shelving edge and to the internal oblique superiorly with interrupted 0 ethibonds. The tails of the mesh were wrapped around the spermatic cord, ensuring adequate room for the cord, and sutured to each other with 0 ethibond, and then directed laterally to lie flat beneath the external oblique aponeurosis.  An additional Ethibond was placed medially to reinforce the slit in the mesh and to tack the mesh medially to the inguinal floor repair.  Hemostasis was ensured within the wound. The Penrose was removed. The external oblique aponeurosis was reapproximated with a running 3-0 Vicryl to re-create a narrowed external ring.  More local was infiltrated around the pubic tubercle and in the plane just below the external oblique. The Scarpa's was reapproximated with interrupted 3-0 Vicryls. The skin was closed with a running subcuticular 4-0 Monocryl. The remainder of the local was injected in the subcutaneous and subcuticular space. The field was then cleaned, benzoin and Steri-Strips and sterile bandage were applied. Both testicles were palpated in the scrotum at the end of the case. The patient was then awakened extubated and taken to PACU in stable condition.    All counts were correct at the completion of the case

## 2024-08-04 NOTE — Anesthesia Procedure Notes (Addendum)
 Procedure Name: Intubation Date/Time: 08/04/2024 7:25 AM  Performed by: Zulema Leita PARAS, CRNAPre-anesthesia Checklist: Patient identified, Emergency Drugs available, Suction available and Patient being monitored Patient Re-evaluated:Patient Re-evaluated prior to induction Oxygen Delivery Method: Circle system utilized Preoxygenation: Pre-oxygenation with 100% oxygen Induction Type: IV induction Ventilation: Mask ventilation without difficulty Laryngoscope Size: Mac and 4 Grade View: Grade III Tube type: Oral Tube size: 7.5 mm Airway Equipment and Method: Stylet Placement Confirmation: ETT inserted through vocal cords under direct vision, positive ETCO2 and breath sounds checked- equal and bilateral Secured at: 21 cm Tube secured with: Tape Dental Injury: Teeth and Oropharynx as per pre-operative assessment  Difficulty Due To: Difficult Airway- due to anterior larynx and Difficult Airway- due to immobile epiglottis Comments: DL with MAC 4 and Grade 3 view. Able to place ETT without use of Glidescope, but in future it may be beneficial to use. Anterior Larynx, large epiglottis.

## 2024-08-04 NOTE — Discharge Instructions (Addendum)
 HERNIA REPAIR: POST OP INSTRUCTIONS   EAT Gradually transition to your usual diet over the next few days after discharge.  WALK Walk an hour a day (cumulative- not all at once).  Control your pain to do that.    CONTROL PAIN Control pain so that you can walk, sleep, tolerate sneezing/coughing, and go up/down stairs.  HAVE A BOWEL MOVEMENT DAILY Keep your bowels regular to avoid problems.  OK to try a laxative to override constipation.  OK to use an antidiarrheal to slow down diarrhea.  Call if not better after 2 tries  CALL IF YOU HAVE PROBLEMS/CONCERNS Call if you are still struggling despite following these instructions. Call if you have concerns not answered by these instructions    DIET: Follow a light bland diet & liquids the first 24 hours after arrival home, such as soup, liquids, starches, etc.  Be sure to drink plenty of fluids.  Quickly advance to a usual solid diet within a few days.  Avoid fast food or heavy meals initially as you are more likely to get nauseated or have irregular bowels.  Take your usually prescribed home medications unless otherwise directed.  PAIN CONTROL: Pain is best controlled by a usual combination of three different methods TOGETHER: Ice/Heat Over the counter pain medication Prescription pain medication Most patients will experience some swelling and bruising around the hernia site and scrotum.  Ice packs or heating pads (30-60 minutes up to 6 times a day) will help. Use ice for the first few days to help decrease swelling and bruising, then switch to heat to help relax tight/sore spots and speed recovery.  Some people prefer to use ice alone, heat alone, or alternating between ice & heat.  Experiment to what works for you.  Swelling and bruising can take several weeks to resolve.   It is helpful to take an over-the-counter pain medication regularly for the first days: Naproxen  (Aleve , etc)  Two 220mg  tabs twice a day OR Ibuprofen (Advil, etc)  Three 200mg  tabs four times a day (every meal & bedtime) AND Acetaminophen  (Tylenol , etc) 325-650mg  four times a day (every meal & bedtime) A  prescription for pain medication should be given to you upon discharge.  Take your pain medication as prescribed, IF NEEDED.  If you are having problems/concerns with the prescription medicine (does not control pain, nausea, vomiting, rash, itching, etc), please call us  [phone] to see if we need to switch you to a different pain medicine that will work better for you and/or control your side effect better. If you need a refill on your pain medication, please contact your pharmacy.  They will contact our office to request authorization. Prescriptions will not be filled after 5 pm or on week-ends.  Avoid getting constipated.  Between the surgery and the pain medications, it is common to experience some constipation.  Increasing fluid intake and taking a fiber supplement (such as Metamucil, Citrucel, FiberCon, MiraLax, etc) 1-2 times a day regularly will usually help prevent this problem from occurring.  A mild laxative (prune juice, Milk of Magnesia, MiraLax, etc) should be taken according to package directions if there are no bowel movements after 48 hours.    Wash / shower every day, starting 2 days after surgery.  You may shower over the steri strips which are waterproof- let the soap and water  run over the area and pat dry.  No rubbing, scrubbing, lotions or ointments to incision. Do not soak or submerge incision.   Remove  your outer bandage (gauze and tape) 2 days after surgery. Steri strips (small white tapes directly on incision) will peel off after 1-2 weeks. You may leave the incision open to air.  You may replace a dressing/Band-Aid to cover an incision for comfort if you wish.  Continue to shower over incision(s) after the dressing is off.  ACTIVITIES as tolerated:   You may resume regular (light) daily activities beginning the next day--such as  daily self-care, walking, climbing stairs--gradually increasing activities as tolerated.  Control your pain so that you can walk an hour a day.  If you can walk 30 minutes without difficulty, it is safe to try more intense activity such as jogging, treadmill, bicycling, low-impact aerobics, swimming, etc. Refrain from the most intensive and strenuous activity such as sit-ups, heavy lifting, contact sports, etc  Refrain from any heavy lifting or straining (more than 20lb) until 6 weeks after surgery.   DO NOT PUSH THROUGH PAIN.  Let pain be your guide: If it hurts to do something, don't do it.  Pain is your body warning you to avoid that activity for another week until the pain goes down. You may drive when you are no longer taking prescription pain medication, you can comfortably wear a seatbelt, and you can safely maneuver your car and apply brakes. You may have sexual intercourse when it is comfortable.   FOLLOW UP in our office Please call [facility] at [phone] to set up an appointment to see your surgeon in the office for a follow-up appointment approximately 2-3 weeks after your surgery. Make sure that you call for this appointment the day you arrive home to insure a convenient appointment time.  9.  If you have disability of FMLA / Family leave forms, please bring the forms to the office for processing.  (do not give to your surgeon).  WHEN TO CALL US  [phone]: Poor pain control Reactions / problems with new medications (rash/itching, nausea, etc)  Fever over 101.5 F (38.5 C) Inability to urinate Nausea and/or vomiting Worsening swelling or bruising Continued bleeding from incision. Increased pain, redness, or drainage from the incision   The clinic staff is available to answer your questions during regular business hours (8:30am-5pm).  Please don't hesitate to call and ask to speak to one of our nurses for clinical concerns.   If you have a medical emergency, go to the nearest  emergency room or call 911.  A surgeon from [facility] is always on call at the hospitals in [facility], [place] [address], [place], [place]  [address] ?  [address] [address], [place], [place]   [address] MAIN: [phone] ? TOLL FREE: [phone] ? FAX: [phone] [email or web address]

## 2024-08-05 ENCOUNTER — Encounter (HOSPITAL_COMMUNITY): Payer: Self-pay | Admitting: Surgery

## 2024-08-11 ENCOUNTER — Encounter: Payer: Self-pay | Admitting: Physician Assistant

## 2024-08-11 ENCOUNTER — Ambulatory Visit: Attending: Physician Assistant | Admitting: Physician Assistant

## 2024-08-11 VITALS — BP 160/100 | HR 80 | Ht 70.0 in | Wt 230.0 lb

## 2024-08-11 DIAGNOSIS — E785 Hyperlipidemia, unspecified: Secondary | ICD-10-CM

## 2024-08-11 DIAGNOSIS — I10 Essential (primary) hypertension: Secondary | ICD-10-CM | POA: Diagnosis not present

## 2024-08-11 DIAGNOSIS — Z01810 Encounter for preprocedural cardiovascular examination: Secondary | ICD-10-CM | POA: Diagnosis not present

## 2024-08-11 DIAGNOSIS — I77810 Thoracic aortic ectasia: Secondary | ICD-10-CM

## 2024-08-11 DIAGNOSIS — I2583 Coronary atherosclerosis due to lipid rich plaque: Secondary | ICD-10-CM

## 2024-08-11 DIAGNOSIS — I2510 Atherosclerotic heart disease of native coronary artery without angina pectoris: Secondary | ICD-10-CM

## 2024-08-11 NOTE — Patient Instructions (Addendum)
 Medication Instructions:  NO CHANGES   Lab Work: NONE TO BE DONE TODAY.   Testing/Procedures: NONE  Follow-Up: At [facility], you and your health needs are our priority.  As part of our continuing mission to provide you with exceptional heart care, our providers are all part of one team.  This team includes your primary Cardiologist (physician) and Advanced Practice Providers or APPs (Physician Assistants and Nurse Practitioners) who all work together to provide you with the care you need, when you need it.  Your next appointment:   6 MONTHS  Provider:   Vinie JAYSON Maxcy, MD    We recommend signing up for the patient portal called MyChart.  Sign up information is provided on this After Visit Summary.  MyChart is used to connect with patients for Virtual Visits (Telemedicine).  Patients are able to view lab/test results, encounter notes, upcoming appointments, etc.  Non-urgent messages can be sent to your provider as well.   To learn more about what you can do with MyChart, go to [email or web address].   Other Instructions: PLEASE DO NOT CONSUME MORE THAN 2 GRAMS OF SALT EACH DAY. (2 GRAM LOW SODIUM DIET LITERATURE GIVEN TODAY).  PLEASE CHECK AND RECORD YOUR BLOOD PRESSURE AND HEART RATE DAILY FOR 2 WEEKS AND THEN SEND TO MICHELE LENZE OR DR. HILTY VIA MYCHART.

## 2024-11-11 ENCOUNTER — Ambulatory Visit: Admitting: Internal Medicine

## 2025-01-14 ENCOUNTER — Other Ambulatory Visit: Payer: Self-pay | Admitting: Family Medicine

## 2025-01-14 DIAGNOSIS — E785 Hyperlipidemia, unspecified: Secondary | ICD-10-CM

## 2025-01-14 DIAGNOSIS — I10 Essential (primary) hypertension: Secondary | ICD-10-CM
